# Patient Record
Sex: Female | Born: 2000 | Race: Black or African American | Hispanic: No | Marital: Single | Smoking: Never smoker
Health system: Southern US, Community
[De-identification: ages and names within clinical notes are randomized; demographics above are authoritative.]

---
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
## 2019-07-01 IMAGING — CR DG TIBIA/FIBULA 2V*L*
4 series · 4 of 4 positions shown · non-contrast
Comparison: None.

CLINICAL DATA: Awoke with left leg pain.

EXAM:
LEFT TIBIA AND FIBULA - 2 VIEW

[tibia ap (1 of 2)]
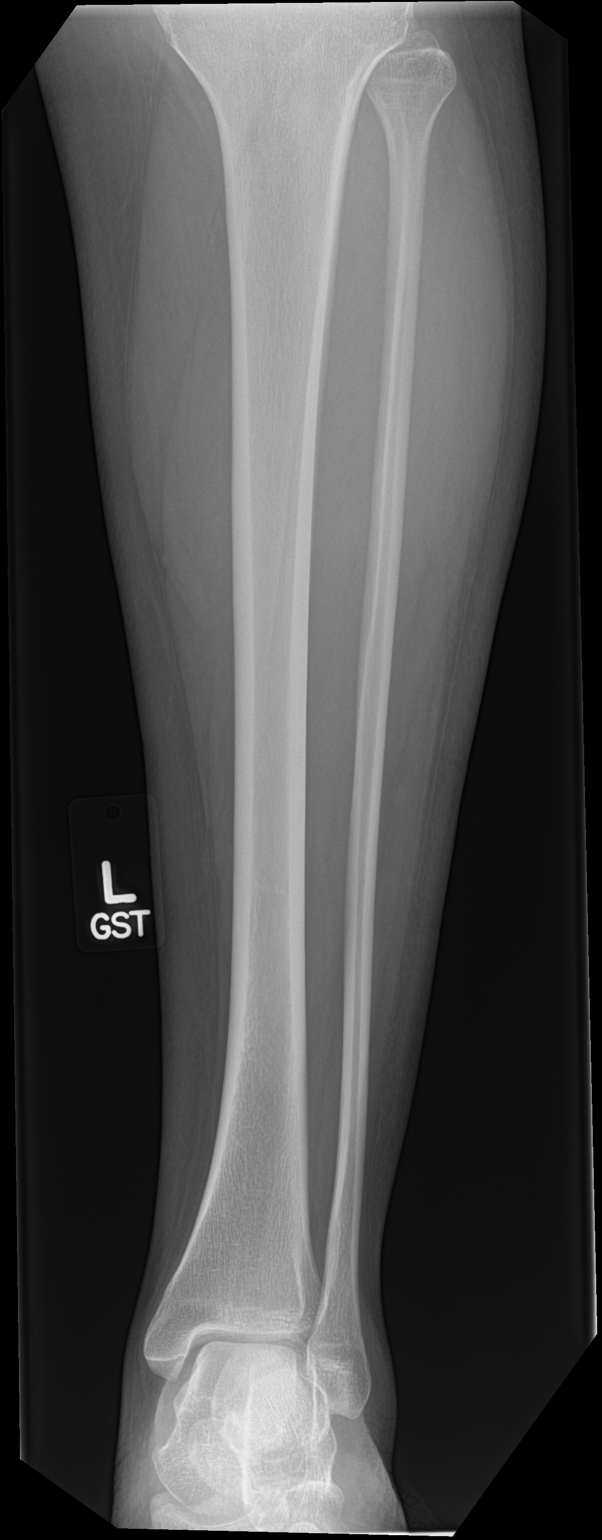

[tibia ap (2 of 2)]
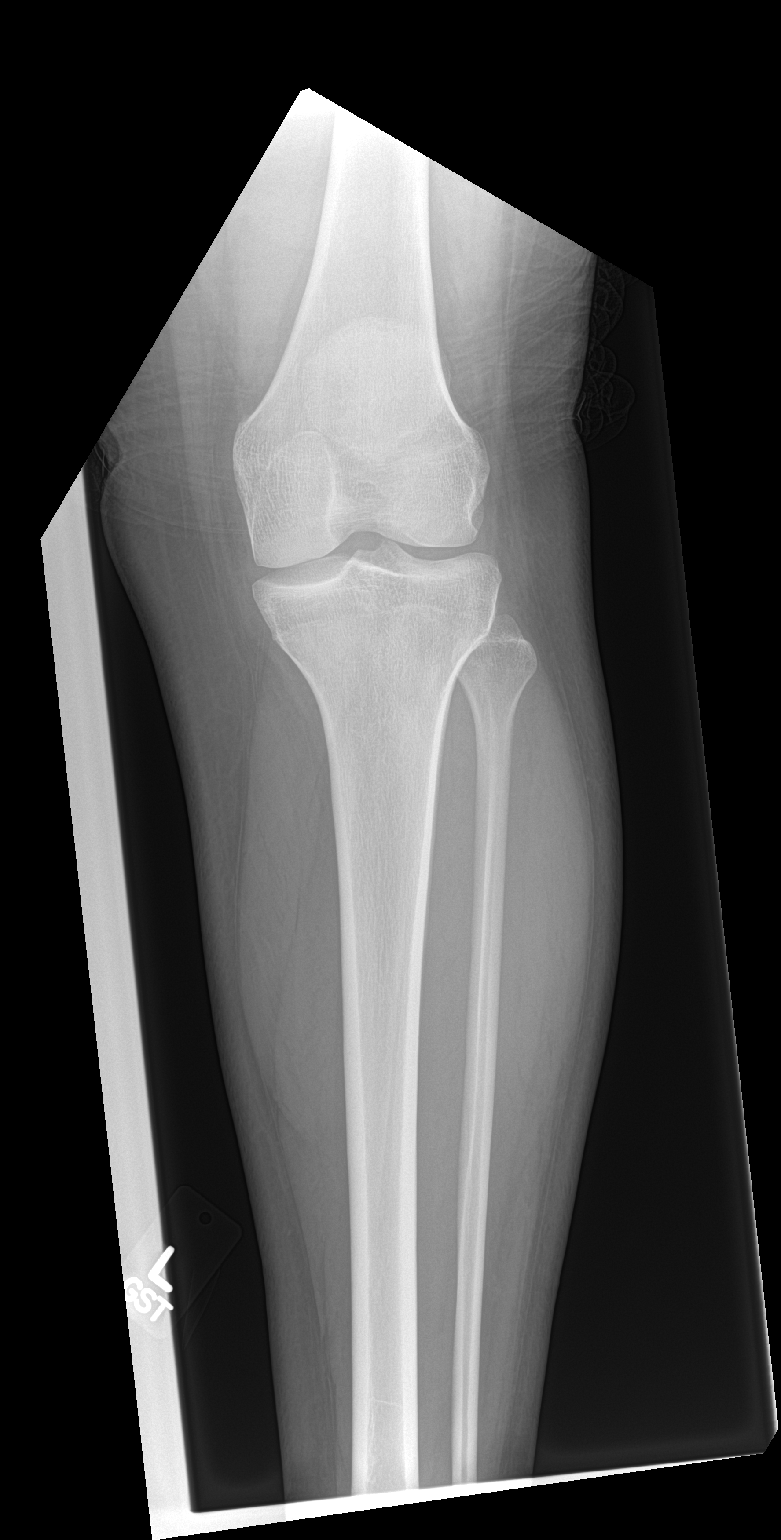

[tibia lat (1 of 2)]
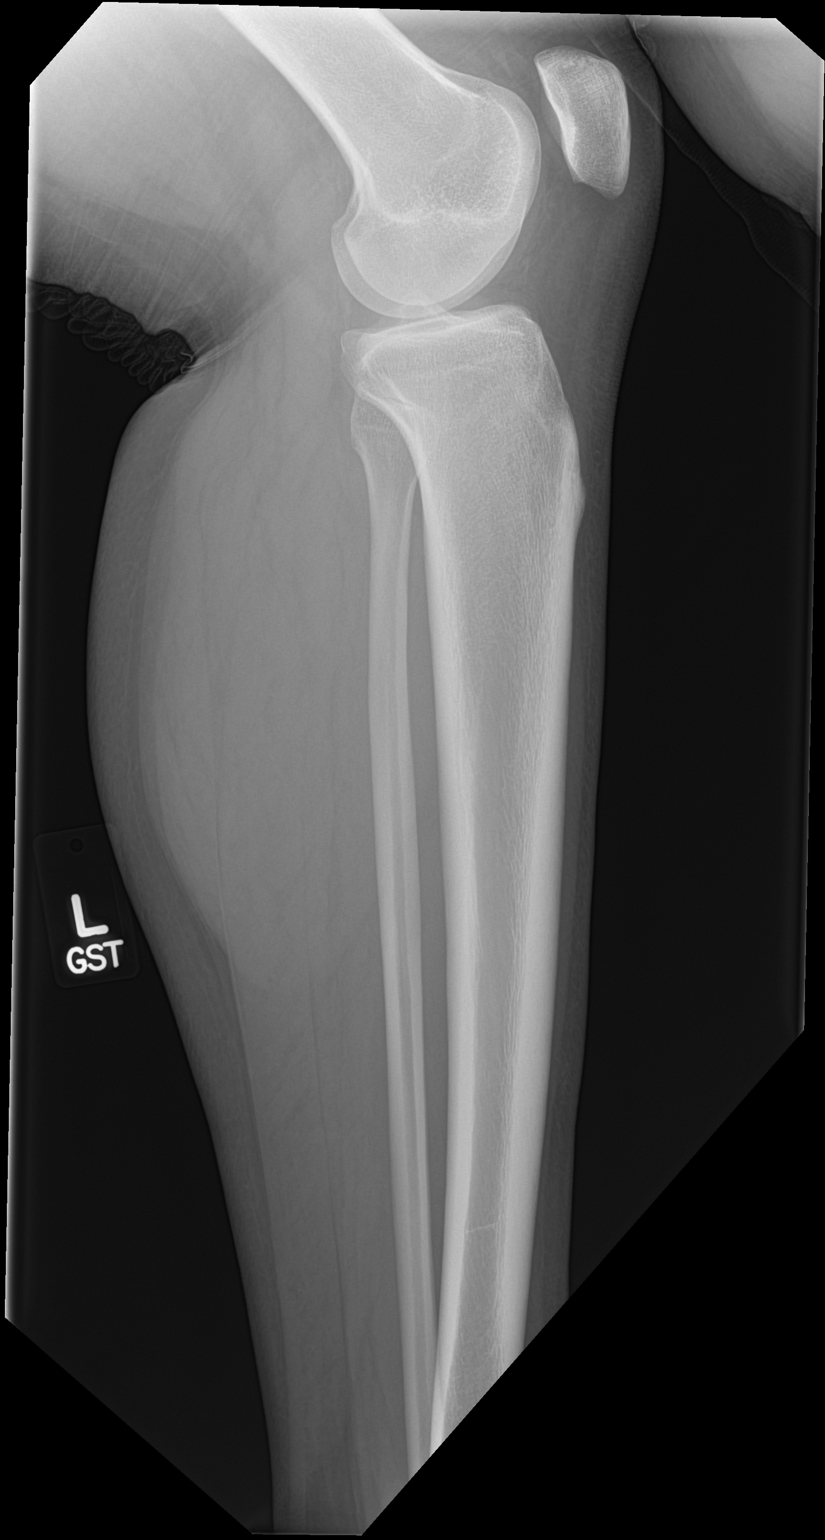

[tibia lat (2 of 2)]
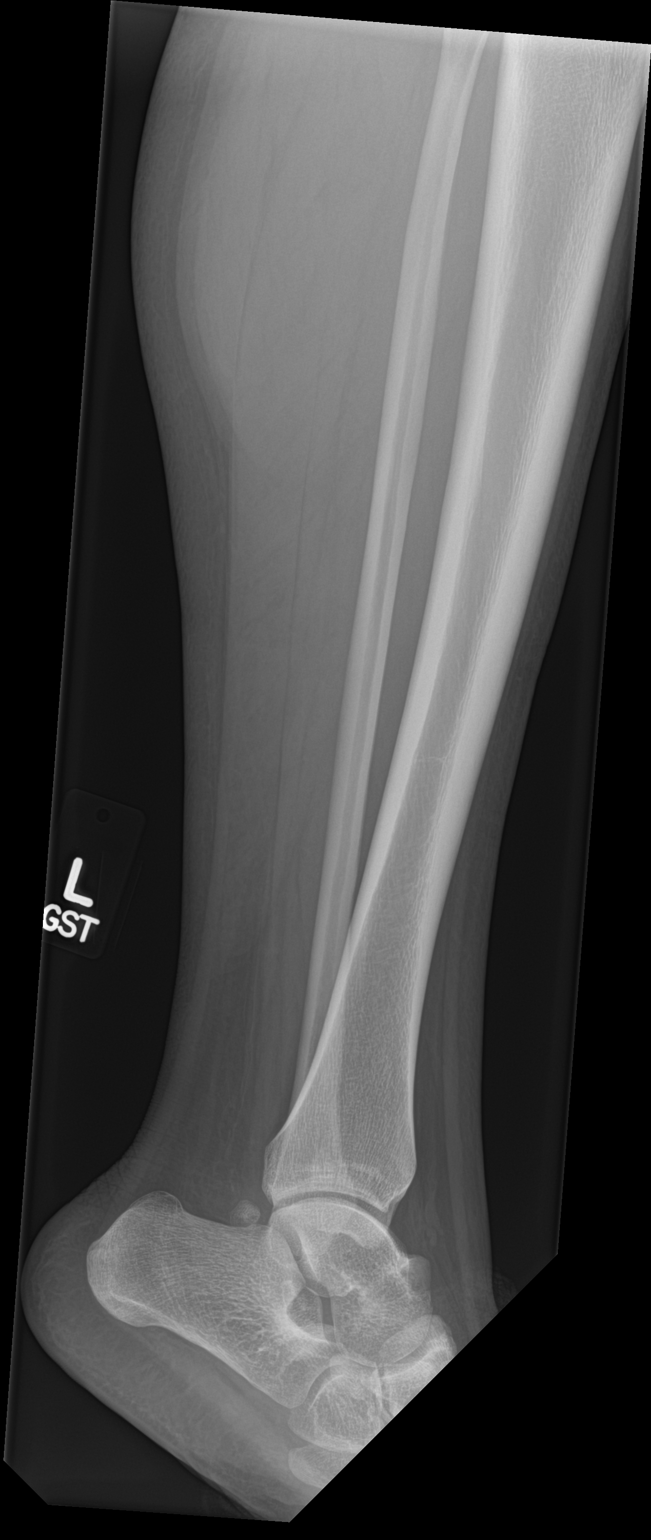

[4 of 4 positions shown; findings below may reference images not displayed]

FINDINGS: There is no evidence of fracture or other focal bone lesions. No
periosteal reaction or bony destructive change. Knee and ankle
alignment is maintained. Soft tissues are unremarkable.
IMPRESSION: Normal radiographs of the left tibia/fibula.

## 2019-12-29 ENCOUNTER — Ambulatory Visit: Payer: 59 | Attending: Internal Medicine

## 2019-12-29 DIAGNOSIS — Z23 Encounter for immunization: Secondary | ICD-10-CM

## 2019-12-29 NOTE — Progress Notes (Signed)
   Covid-19 Vaccination Clinic  Name:  Ruth Escobar    MRN: [id number] DOB: 08-08-2001  12/29/2019  [name] was observed post Covid-19 immunization for 30 minutes based on pre-vaccination screening without incident. She was provided with Vaccine Information Sheet and instruction to access the V-Safe system.   [name] was instructed to call 911 with any severe reactions post vaccine: Marland Kitchen Difficulty breathing  . Swelling of face and throat  . A fast heartbeat  . A bad rash all over body  . Dizziness and weakness   Immunizations Administered    Name Date Dose VIS Date Route   Pfizer COVID-19 Vaccine 12/29/2019  3:06 PM 0.3 mL 08/31/2019 Intramuscular   Manufacturer: [employer], [employer]   Lot: [id number]   NDC: [address]-0

## 2020-01-22 ENCOUNTER — Ambulatory Visit: Payer: 59 | Attending: Internal Medicine

## 2020-01-22 DIAGNOSIS — Z23 Encounter for immunization: Secondary | ICD-10-CM

## 2020-01-22 NOTE — Progress Notes (Signed)
   Covid-19 Vaccination Clinic  Name:  Ruth Escobar    MRN: [id number] DOB: 2000-10-19  01/22/2020  [name] was observed post Covid-19 immunization for 30 minutes based on pre-vaccination screening without incident. She was provided with Vaccine Information Sheet and instruction to access the V-Safe system.   [name] was instructed to call 911 with any severe reactions post vaccine: Marland Kitchen Difficulty breathing  . Swelling of face and throat  . A fast heartbeat  . A bad rash all over body  . Dizziness and weakness   Immunizations Administered    Name Date Dose VIS Date Route   Pfizer COVID-19 Vaccine 01/22/2020  8:54 AM 0.3 mL 11/14/2018 Intramuscular   Manufacturer: [employer], [employer]   Lot: [id number]   NDC: [address]-8

## 2020-01-23 ENCOUNTER — Ambulatory Visit: Payer: 59

## 2020-07-02 ENCOUNTER — Other Ambulatory Visit: Payer: Self-pay

## 2020-07-02 ENCOUNTER — Encounter (HOSPITAL_COMMUNITY): Payer: Self-pay | Admitting: Emergency Medicine

## 2020-07-02 ENCOUNTER — Ambulatory Visit (HOSPITAL_COMMUNITY)
Admission: EM | Admit: 2020-07-02 | Discharge: 2020-07-02 | Disposition: A | Discharge status: Home / Self Care | Payer: 59 | Attending: Family Medicine | Admitting: Family Medicine

## 2020-07-02 DIAGNOSIS — R079 Chest pain, unspecified: Secondary | ICD-10-CM | POA: Diagnosis not present

## 2020-07-02 DIAGNOSIS — M940 Chondrocostal junction syndrome [Tietze]: Secondary | ICD-10-CM

## 2020-07-02 NOTE — ED Triage Notes (Signed)
Pt c/o chest pain mid sternum x 3 weeks. 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 The pain is consistent. She denies SOB or trouble breathing. She is having some left arm pain that began today. She was having neck and jaw pain earlier that subsided with aleeve.

## 2020-07-03 NOTE — ED Provider Notes (Signed)
[facility] CARE CENTER   [id number] 07/02/20 Arrival Time: 9528  ASSESSMENT & PLAN:  1. Chest pain, unspecified type   2. Costochondritis     Patient history and exam consistent with non-cardiac cause of chest pain. Conservative measures indicated.  ECG: Performed today and interpreted by me: no signs of ischemia  Discussed suspicion of MSK related chest pain. Discussed. OTC ibuprofen 800mg  TID with food.  Chest pain precautions given. Reviewed expectations re: course of current medical issues. Questions answered. Outlined signs and symptoms indicating need for more acute intervention. Patient verbalized understanding. After Visit Summary given.   SUBJECTIVE:  History from: patient. Ruth Escobar is a 19 y.o. female who presents with complaint of intermittent non-radiating chest pain. Gradual onset over past 3 weeks. Lifts a lot at work and questions relation. Is worse with certain movements. No SOB/n/v. Afebrile. No recent illnesses. Is on OCP. No LE edema/swelling.  Illicit drug use: none.  Social History   Tobacco Use  Smoking Status Never Smoker  Smokeless Tobacco Never Used   Social History   Substance and Sexual Activity  Alcohol Use No     OBJECTIVE:  Vitals:   07/02/20 1920  BP: 122/77  Pulse: 88  Resp: 16  Temp: 98.2 F (36.8 C)  TempSrc: Oral  SpO2: 99%    General appearance: alert, oriented, no acute distress Eyes: PERRLA; EOMI; conjunctivae normal HENT: normocephalic; atraumatic Neck: supple with FROM Lungs: without labored respirations; speaks full sentences without difficulty; CTAB Heart: regular rate and rhythm without murmer Chest Wall: with upper mid to left tenderness to palpation Abdomen: soft, non-tender; no guarding or rebound tenderness Extremities: without edema; without calf swelling or tenderness; symmetrical without gross deformities Skin: warm and dry; without rash or lesions Neuro: normal gait Psychological: alert  and cooperative; normal mood and affect   Allergies  Allergen Reactions  . Citrus Rash  . Pollen Extract Cough  . Shellfish Allergy   . Elidel [Pimecrolimus] Rash    Past Medical History:  Diagnosis Date  . Asthma    Social History   Socioeconomic History  . Marital status: Single    Spouse name: Not on file  . Number of children: Not on file  . Years of education: Not on file  . Highest education level: Not on file  Occupational History  . Not on file  Tobacco Use  . Smoking status: Never Smoker  . Smokeless tobacco: Never Used  Vaping Use  . Vaping Use: Never used  Substance and Sexual Activity  . Alcohol use: No  . Drug use: No  . Sexual activity: Not on file  Other Topics Concern  . Not on file  Social History Narrative  . Not on file   Social Determinants of Health   Financial Resource Strain:   . Difficulty of Paying Living Expenses: Not on file  Food Insecurity:   . Worried About 07/04/20 in the Last Year: Not on file  . Ran Out of Food in the Last Year: Not on file  Transportation Needs:   . Lack of Transportation (Medical): Not on file  . Lack of Transportation (Non-Medical): Not on file  Physical Activity:   . Days of Exercise per Week: Not on file  . Minutes of Exercise per Session: Not on file  Stress:   . Feeling of Stress : Not on file  Social Connections:   . Frequency of Communication with Friends and Family: Not on file  . Frequency of  Social Gatherings with Friends and Family: Not on file  . Attends Religious Services: Not on file  . Active Member of Clubs or Organizations: Not on file  . Attends Banker Meetings: Not on file  . Marital Status: Not on file  Intimate Partner Violence:   . Fear of Current or Ex-Partner: Not on file  . Emotionally Abused: Not on file  . Physically Abused: Not on file  . Sexually Abused: Not on file   Family History  Problem Relation Age of Onset  . Cancer Mother   . Cancer  Father   . Hypertension Father    History reviewed. No pertinent surgical history.   Mardella Layman, MD 07/03/20 1006

## 2022-02-11 ENCOUNTER — Ambulatory Visit (HOSPITAL_COMMUNITY)
Admission: EM | Admit: 2022-02-11 | Discharge: 2022-02-11 | Disposition: A | Discharge status: Home / Self Care | Payer: 59 | Attending: Internal Medicine | Admitting: Internal Medicine

## 2022-02-11 ENCOUNTER — Encounter (HOSPITAL_COMMUNITY): Payer: Self-pay

## 2022-02-11 DIAGNOSIS — J029 Acute pharyngitis, unspecified: Secondary | ICD-10-CM | POA: Diagnosis present

## 2022-02-11 DIAGNOSIS — H9201 Otalgia, right ear: Secondary | ICD-10-CM

## 2022-02-11 LAB — POCT RAPID STREP A, ED / UC: Streptococcus, Group A Screen (Direct): NEGATIVE

## 2022-02-11 MED ORDER — AMOXICILLIN 500 MG PO CAPS: 500.0000 mg | ORAL_CAPSULE | Freq: Two times a day (BID) | ORAL | 0 refills | Status: AC

## 2022-02-11 NOTE — ED Triage Notes (Signed)
Onset 3 days ago with sore throat,body aches and ear pain. Pt has been dealing with mold in her apartment.

## 2022-02-11 NOTE — ED Provider Notes (Signed)
[facility]    CSN: [id number] Arrival date & time: 02/11/22  1928      History   Chief Complaint Chief Complaint  Patient presents with   Fever   Sore Throat   Otalgia    HPI Ruth Escobar is a 21 y.o. female.   Patient presents with sore throat and right ear pain that has been present for approximately 4 days.  Denies any associated respiratory symptoms, cough, fever, any known sick contacts.  Denies chest pain, shortness of breath, nausea, vomiting, diarrhea, abdominal pain.  Patient has taken several over-the-counter cold and flu medications as well as Tylenol with minimal improvement in symptoms.  Patient also reports that they had an episode of feeling like their "heart was racing" and mild chest pain when symptoms first started that has now resolved.  This episode lasted approximately 5 minutes and there has been no episodes since.  Denies history of any cardiac issues.  Patient attributing this to [place] the [place] when symptoms first started and "not feeling well" when palpitations occur.   Fever Sore Throat  Otalgia  Past Medical History:  Diagnosis Date   Asthma     There are no problems to display for this patient.   History reviewed. No pertinent surgical history.  OB History   No obstetric history on file.      Home Medications    Prior to Admission medications   Medication Sig Start Date End Date Taking? Authorizing Provider  amoxicillin (AMOXIL) 500 MG capsule Take 1 capsule (500 mg total) by mouth 2 (two) times daily for 10 days. 02/11/22 02/21/22 Yes Chardonnay Holzmann, Acie Fredrickson, FNP  Albuterol Sulfate (PROAIR HFA IN) Inhale into the lungs.    [provider]  albuterol-ipratropium (COMBIVENT) 18-103 MCG/ACT inhaler Inhale 2 puffs into the lungs every 6 (six) hours as needed.    [provider]  azithromycin (ZITHROMAX) 250 MG tablet Take as directed Patient not taking: Reported on 01/28/2017 09/24/16   Lenn Sink,  DPM  azithromycin [facility]) 250 MG tablet Take as directed 01/28/17   Lenn Sink, DPM  beclomethasone (QVAR) 80 MCG/ACT inhaler Inhale 2 puffs into the lungs 2 (two) times daily. 08/08/15   Baxter Hire, MD  cetirizine (ZYRTEC) 10 MG tablet Take 10 mg by mouth daily.    [provider]  desonide (DESOWEN) 0.05 % cream Apply 1 application topically 2 (two) times daily.    [provider]  desoximetasone (TOPICORT) 0.05 % cream Apply topically 2 (two) times daily.    [provider]  fluocinonide cream (LIDEX) 0.05 % Apply 1 application topically 2 (two) times daily.    [provider]  fluticasone (FLONASE) 50 MCG/ACT nasal spray Place 2 sprays into both nostrils daily.    [provider]  hydrocodone-ibuprofen (VICOPROFEN) 5-200 MG tablet Take 1 tablet by mouth every 8 (eight) hours as needed for pain. Patient not taking: Reported on 01/28/2017 01/23/16   Lenn Sink, DPM  hydrocortisone cream 0.5 % Apply 1 application topically as needed for itching.    [provider]  montelukast (SINGULAIR) 10 MG tablet Take 10 mg by mouth at bedtime.    [provider]  Norethindrone Acetate-Ethinyl Estrad-FE (BLISOVI 24 FE) 1-20 MG-MCG(24) tablet Blisovi 24 Fe 1 mg-20 mcg (24)/75 mg (4) tablet  TAKE 1 TABLET BY MOUTH EVERY DAY    [provider]  triamcinolone cream (KENALOG) 0.1 % Apply 1 application topically  2 (two) times daily.    [provider]    Family History Family History  Problem Relation Age of Onset   Cancer Mother    Cancer Father    Hypertension Father     Social History Social History   Tobacco Use   Smoking status: Never   Smokeless tobacco: Never  Vaping Use   Vaping Use: Never used  Substance Use Topics   Alcohol use: No   Drug use: No     Allergies   Citrus, Pollen extract, Shellfish allergy, and Elidel [pimecrolimus]   Review of Systems Review of Systems Per  HPI  Physical Exam Triage Vital Signs ED Triage Vitals [02/11/22 2004]  Enc Vitals Group     BP 127/87     Pulse Rate (!) 115     Resp 18     Temp 99.5 F (37.5 C)     Temp Source Oral     SpO2 96 %     Weight      Height      Head Circumference      Peak Flow      Pain Score 7     Pain Loc      Pain Edu?      Excl. in GC?    No data found.  Updated Vital Signs BP 127/87 (BP Location: Left Arm)   Pulse (!) 115   Temp 99.5 F (37.5 C) (Oral)   Resp 18   SpO2 96%   Visual Acuity Right Eye Distance:   Left Eye Distance:   Bilateral Distance:    Right Eye Near:   Left Eye Near:    Bilateral Near:     Physical Exam Constitutional:      General: She is not in acute distress.    Appearance: Normal appearance. She is not toxic-appearing or diaphoretic.  HENT:     Head: Normocephalic and atraumatic.     Right Ear: Tympanic membrane and ear canal normal.     Left Ear: Tympanic membrane and ear canal normal.     Nose: Congestion present.     Mouth/Throat:     Mouth: Mucous membranes are moist.     Pharynx: Posterior oropharyngeal erythema present. No oropharyngeal exudate.     Tonsils: No tonsillar exudate or tonsillar abscesses. 1+ on the right. 1+ on the left.  Eyes:     Extraocular Movements: Extraocular movements intact.     Conjunctiva/sclera: Conjunctivae normal.     Pupils: Pupils are equal, round, and reactive to light.  Cardiovascular:     Rate and Rhythm: Normal rate and regular rhythm.     Pulses: Normal pulses.     Heart sounds: Normal heart sounds.  Pulmonary:     Effort: Pulmonary effort is normal. No respiratory distress.     Breath sounds: Normal breath sounds. No stridor. No wheezing, rhonchi or rales.  Abdominal:     General: Abdomen is flat. Bowel sounds are normal.     Palpations: Abdomen is soft.  Musculoskeletal:        General: Normal range of motion.     Cervical back: Normal range of motion.  Skin:    General: Skin is warm and dry.   Neurological:     General: No focal deficit present.     Mental Status: She is alert and oriented to person, place, and time. Mental status is at baseline.  Psychiatric:        Mood and Affect: Mood  normal.        Behavior: Behavior normal.     UC Treatments / Results  Labs (all labs ordered are listed, but only abnormal results are displayed) Labs Reviewed  CULTURE, GROUP A STREP [facility])  POCT RAPID STREP A, ED / UC    EKG   Radiology No results found.  Procedures Procedures (including critical care time)  Medications Ordered in UC Medications - No data to display  Initial Impression / Assessment and Plan / UC Course  I have reviewed the triage vital signs and the nursing notes.  Pertinent labs & imaging results that were available during my care of the patient were reviewed by me and considered in my medical decision making (see chart for details).     Rapid strep was negative but still suspicious of strep throat given appearance of posterior pharynx on exam.  No signs of peritonsillar abscess on exam.  Will opt to treat with amoxicillin antibiotic.  Discussed that there is a possibility of viral cause of symptoms as well.  Discussed supportive care and symptom management with patient.  Patient's cardiovascular exam was normal and patient denies any recent palpitations.  Offered patient EKG for evaluation but patient declined.  Risks associated with not doing EKG were discussed with patient.  Also discussed that there are limited resources to evaluate cardiovascular etiologies in urgent care.  No concern for cardiac etiology as this episode of tachycardia was most likely due to acute illness.  Patient was advised that if symptoms of palpitations occur again, then they need to go to the hospital for further evaluation and management.  Patient voiced understanding.  Discussed strict return and ER precautions.  Patient verbalized understanding and was agreeable with plan. Final  Clinical Impressions(s) / UC Diagnoses   Final diagnoses:  Sore throat  Acute otalgia, right     Discharge Instructions      Rapid strep was negative.  Throat culture is pending.  I am still highly suspicious of strep throat given the appearance of your throat on exam.  You are being treated with an antibiotic.  Please follow-up if symptoms persist or worsen.    ED Prescriptions     Medication Sig Dispense Auth. Provider   amoxicillin (AMOXIL) 500 MG capsule Take 1 capsule (500 mg total) by mouth 2 (two) times daily for 10 days. 20 capsule Gustavus Bryant, [place]      PDMP not reviewed this encounter.   Gustavus Bryant, [place] 02/11/22 2037

## 2022-02-11 NOTE — Discharge Instructions (Signed)
Rapid strep was negative.  Throat culture is pending.  I am still highly suspicious of strep throat given the appearance of your throat on exam.  You are being treated with an antibiotic.  Please follow-up if symptoms persist or worsen.

## 2022-02-13 LAB — CULTURE, GROUP A STREP (THRC)

## 2022-10-01 DIAGNOSIS — H5213 Myopia, bilateral: Secondary | ICD-10-CM | POA: Diagnosis not present

## 2022-12-15 DIAGNOSIS — Z01419 Encounter for gynecological examination (general) (routine) without abnormal findings: Secondary | ICD-10-CM | POA: Diagnosis not present

## 2022-12-15 DIAGNOSIS — N946 Dysmenorrhea, unspecified: Secondary | ICD-10-CM | POA: Diagnosis not present

## 2022-12-15 DIAGNOSIS — Z6839 Body mass index (BMI) 39.0-39.9, adult: Secondary | ICD-10-CM | POA: Diagnosis not present

## 2022-12-18 DIAGNOSIS — R079 Chest pain, unspecified: Secondary | ICD-10-CM | POA: Diagnosis not present

## 2022-12-19 DIAGNOSIS — R079 Chest pain, unspecified: Secondary | ICD-10-CM | POA: Diagnosis not present

## 2023-03-04 ENCOUNTER — Encounter (HOSPITAL_COMMUNITY): Payer: Self-pay | Admitting: Emergency Medicine

## 2023-03-04 ENCOUNTER — Ambulatory Visit (INDEPENDENT_AMBULATORY_CARE_PROVIDER_SITE_OTHER): Payer: 59

## 2023-03-04 ENCOUNTER — Ambulatory Visit (HOSPITAL_COMMUNITY)
Admission: EM | Admit: 2023-03-04 | Discharge: 2023-03-04 | Disposition: A | Discharge status: Home / Self Care | Payer: 59 | Attending: Internal Medicine | Admitting: Internal Medicine

## 2023-03-04 DIAGNOSIS — Z8709 Personal history of other diseases of the respiratory system: Secondary | ICD-10-CM

## 2023-03-04 DIAGNOSIS — S60011A Contusion of right thumb without damage to nail, initial encounter: Secondary | ICD-10-CM | POA: Diagnosis not present

## 2023-03-04 DIAGNOSIS — S6991XA Unspecified injury of right wrist, hand and finger(s), initial encounter: Secondary | ICD-10-CM | POA: Diagnosis not present

## 2023-03-04 MED ORDER — IBUPROFEN 800 MG PO TABS: 800.0000 mg | ORAL_TABLET | Freq: Three times a day (TID) | ORAL | 0 refills | Status: DC

## 2023-03-04 MED ORDER — ALBUTEROL SULFATE HFA 108 (90 BASE) MCG/ACT IN AERS: 2.0000 | INHALATION_SPRAY | Freq: Four times a day (QID) | RESPIRATORY_TRACT | 0 refills | Status: AC | PRN

## 2023-03-04 NOTE — ED Provider Notes (Signed)
[facility]    CSN: [id number] Arrival date & time: 03/04/23  0825      History   Chief Complaint Chief Complaint  Patient presents with   Finger Injury    HPI Ruth Escobar is a 22 y.o. female.   Patient presents today with a 2-day history of right thumb pain.  Reports that she closed a portion of her thumb and her car door and has had ongoing pain since that time.  If she is not moving it pain is minimal but increases to 2/3 with attempted movement and increases to 10 with palpation, localized to distal right thumb, described as throbbing, no aggravating relieving factors identified.  She is right-handed.  Denies any numbness or paresthesias.  She has tried ibuprofen with minimal improvement of symptoms.  She is confident that she is not pregnant.  She is having difficulty with daily activities as result of her symptoms.    Past Medical History:  Diagnosis Date   Asthma     There are no problems to display for this patient.   History reviewed. No pertinent surgical history.  OB History   No obstetric history on file.      Home Medications    Prior to Admission medications   Medication Sig Start Date End Date Taking? Authorizing Provider  ibuprofen (ADVIL) 800 MG tablet Take 1 tablet (800 mg total) by mouth 3 (three) times daily. 03/04/23  Yes Marchell Froman K, PA-C  albuterol (PROAIR HFA) 108 (90 Base) MCG/ACT inhaler Inhale 2 puffs into the lungs every 6 (six) hours as needed. 03/04/23   Kassadee Carawan, Noberto Retort, PA-C  albuterol-ipratropium (COMBIVENT) 18-103 MCG/ACT inhaler Inhale 2 puffs into the lungs every 6 (six) hours as needed.    [provider]  beclomethasone (QVAR) 80 MCG/ACT inhaler Inhale 2 puffs into the lungs 2 (two) times daily. 08/08/15   Baxter Hire, MD  cetirizine (ZYRTEC) 10 MG tablet Take 10 mg by mouth daily.    [provider]  desonide (DESOWEN) 0.05 % cream Apply 1 application topically 2 (two) times daily.     [provider]  desoximetasone (TOPICORT) 0.05 % cream Apply topically 2 (two) times daily.    [provider]  fluocinonide cream (LIDEX) 0.05 % Apply 1 application topically 2 (two) times daily.    [provider]  fluticasone (FLONASE) 50 MCG/ACT nasal spray Place 2 sprays into both nostrils daily.    [provider]  hydrocodone-ibuprofen (VICOPROFEN) 5-200 MG tablet Take 1 tablet by mouth every 8 (eight) hours as needed for pain. Patient not taking: Reported on 01/28/2017 01/23/16   Lenn Sink, DPM  hydrocortisone cream 0.5 % Apply 1 application topically as needed for itching.    [provider]  montelukast (SINGULAIR) 10 MG tablet Take 10 mg by mouth at bedtime.    [provider]  Norethindrone Acetate-Ethinyl Estrad-FE (BLISOVI 24 FE) 1-20 MG-MCG(24) tablet Blisovi 24 Fe 1 mg-20 mcg (24)/75 mg (4) tablet  TAKE 1 TABLET BY MOUTH EVERY DAY    [provider]  triamcinolone cream (KENALOG) 0.1 % Apply 1 application topically 2 (two) times daily.    [provider]    Family History Family History  Problem Relation Age of Onset   Cancer Mother    Cancer Father    Hypertension Father     Social History Social History   Tobacco Use   Smoking status: Never   Smokeless tobacco: Never  Vaping  Use   Vaping Use: Never used  Substance Use Topics   Alcohol use: No   Drug use: No     Allergies   Citrus, Pollen extract, Iodine, Shellfish allergy, and Elidel [pimecrolimus]   Review of Systems Review of Systems  Constitutional:  Positive for activity change. Negative for appetite change, fatigue and fever.  Musculoskeletal:  Positive for arthralgias. Negative for myalgias.  Skin:  Negative for color change and wound.  Neurological:  Negative for weakness and numbness.     Physical Exam Triage Vital Signs ED Triage Vitals  Enc Vitals Group     BP 03/04/23 0924 113/81     Pulse Rate 03/04/23 0924 98      Resp 03/04/23 0924 17     Temp 03/04/23 0924 98.3 F (36.8 C)     Temp Source 03/04/23 0924 Oral     SpO2 03/04/23 0924 99 %     Weight --      Height --      Head Circumference --      Peak Flow --      Pain Score 03/04/23 0921 4     Pain Loc --      Pain Edu? --      Excl. in GC? --    No data found.  Updated Vital Signs BP 113/81 (BP Location: Left Arm)   Pulse 98   Temp 98.3 F (36.8 C) (Oral)   Resp 17   LMP 02/14/2023 (Approximate)   SpO2 99%   Visual Acuity Right Eye Distance:   Left Eye Distance:   Bilateral Distance:    Right Eye Near:   Left Eye Near:    Bilateral Near:     Physical Exam Vitals reviewed.  Constitutional:      General: She is awake. She is not in acute distress.    Appearance: Normal appearance. She is well-developed. She is not ill-appearing.     Comments: Very pleasant female appears stated age in no acute distress sitting comfortably in exam room  HENT:     Head: Normocephalic and atraumatic.  Cardiovascular:     Rate and Rhythm: Normal rate and regular rhythm.     Heart sounds: Normal heart sounds, S1 normal and S2 normal. No murmur heard.    Comments: Capillary refill within 2 seconds right thumb Pulmonary:     Effort: Pulmonary effort is normal.     Breath sounds: Normal breath sounds. No wheezing, rhonchi or rales.     Comments: Clear to auscultation bilaterally Musculoskeletal:     Right hand: Swelling, tenderness and bony tenderness present. No deformity. Decreased range of motion. Normal strength. Normal sensation. There is no disruption of two-point discrimination. Normal capillary refill.     Comments: Right hand: Significant tenderness palpation over distal right thumb with associated small subungual hematoma involving approximately 20% of nail.  Decreased range of motion with flexion secondary to pain.  Hand is neurovascularly intact.  Psychiatric:        Behavior: Behavior is cooperative.      UC Treatments /  Results  Labs (all labs ordered are listed, but only abnormal results are displayed) Labs Reviewed - No data to display  EKG   Radiology DG Finger Thumb Right  Result Date: 03/04/2023 CLINICAL DATA:  Smashed in car door.  Bruising under thumb nail. EXAM: RIGHT THUMB 2+V COMPARISON:  None Available. FINDINGS: Normal bone mineralization. Joint spaces are preserved. The cortices are intact. No acute fracture is seen.  No dislocation. IMPRESSION: No acute fracture. Electronically Signed   By: Neita Garnet M.D.   On: 03/04/2023 09:56    Procedures Procedures (including critical care time)  Medications Ordered in UC Medications - No data to display  Initial Impression / Assessment and Plan / UC Course  I have reviewed the triage vital signs and the nursing notes.  Pertinent labs & imaging results that were available during my care of the patient were reviewed by me and considered in my medical decision making (see chart for details).     Patient is well-appearing, afebrile, nontoxic, nontachycardic.  X-ray was obtained that showed no acute osseous abnormality.  Suspect contusion as etiology of symptoms.  We did discuss potential utility of draining subungual hematoma but given it is relatively small patient declined this as I am unsure that it would provide much relief of symptoms.  She was placed in a makeshift splint for comfort and support.  Will start ibuprofen 800 mg for pain and we discussed that she is not to take NSAIDs with this medication due to risk of GI bleeding.  She can use acetaminophen/Tylenol for breakthrough pain.  If her symptoms or not improving or if anything worsens she is to return for reevaluation including increasing pain, swelling, discoloration, numbness, paresthesias.  Strict return precautions given.  Patient declined work excuse note.  At the end of visit patient requested a refill of her albuterol rescue inhaler.  Reports that she uses intermittently as needed  but will not see her primary care for a few weeks and is hoping she can get a refill now.  She denies any current asthma symptoms.  Denies previous hospitalization related to asthma or intubation.  Discussed that if she has recurrent or severe symptoms she should return for reevaluation.  Final Clinical Impressions(s) / UC Diagnoses   Final diagnoses:  Contusion of right thumb without damage to nail, initial encounter  History of asthma     Discharge Instructions      Your x-ray was normal with no evidence of fracture/broken bones.  This probably take several weeks to heal.  Use the splint to help provide protection and comfort.  Take ibuprofen 800 mg for pain.  Do not take NSAIDs with this medication including aspirin, ibuprofen/Advil, naproxen/Aleve.  You can use acetaminophen/Tylenol for breakthrough pain.  If your symptoms or not improving within a few days please return for reevaluation.  If anything worsens return for reevaluation.  I refilled your inhaler.  Follow-up with your primary care.  If anything worsens return for reevaluation.     ED Prescriptions     Medication Sig Dispense Auth. Provider   albuterol (PROAIR HFA) 108 (90 Base) MCG/ACT inhaler Inhale 2 puffs into the lungs every 6 (six) hours as needed. 18 g Jayde Mcallister K, PA-C   ibuprofen (ADVIL) 800 MG tablet Take 1 tablet (800 mg total) by mouth 3 (three) times daily. 21 tablet Juliani Laduke, Noberto Retort, PA-C      PDMP not reviewed this encounter.   Jeani Hawking, PA-C 03/04/23 1014

## 2023-03-04 NOTE — Discharge Instructions (Addendum)
Your x-ray was normal with no evidence of fracture/broken bones.  This probably take several weeks to heal.  Use the splint to help provide protection and comfort.  Take ibuprofen 800 mg for pain.  Do not take NSAIDs with this medication including aspirin, ibuprofen/Advil, naproxen/Aleve.  You can use acetaminophen/Tylenol for breakthrough pain.  If your symptoms or not improving within a few days please return for reevaluation.  If anything worsens return for reevaluation.  I refilled your inhaler.  Follow-up with your primary care.  If anything worsens return for reevaluation.

## 2023-03-04 NOTE — ED Triage Notes (Signed)
Pt reports right thumb got smashed 2 days ago in car door. Pt reports bruising under right thumb nail. Reports pain with pain.

## 2023-03-07 DIAGNOSIS — R7309 Other abnormal glucose: Secondary | ICD-10-CM | POA: Diagnosis not present

## 2023-03-07 DIAGNOSIS — E669 Obesity, unspecified: Secondary | ICD-10-CM | POA: Diagnosis not present

## 2023-03-07 DIAGNOSIS — E559 Vitamin D deficiency, unspecified: Secondary | ICD-10-CM | POA: Diagnosis not present

## 2023-03-07 DIAGNOSIS — D649 Anemia, unspecified: Secondary | ICD-10-CM | POA: Diagnosis not present

## 2023-03-07 DIAGNOSIS — Z0000 Encounter for general adult medical examination without abnormal findings: Secondary | ICD-10-CM | POA: Diagnosis not present

## 2023-03-09 ENCOUNTER — Emergency Department (HOSPITAL_COMMUNITY)
Admission: EM | Admit: 2023-03-09 | Discharge: 2023-03-10 | Disposition: A | Discharge status: Home / Self Care | Payer: 59 | Attending: Student | Admitting: Student

## 2023-03-09 ENCOUNTER — Encounter (HOSPITAL_COMMUNITY): Payer: Self-pay

## 2023-03-09 ENCOUNTER — Other Ambulatory Visit: Payer: Self-pay

## 2023-03-09 DIAGNOSIS — J45909 Unspecified asthma, uncomplicated: Secondary | ICD-10-CM | POA: Insufficient documentation

## 2023-03-09 DIAGNOSIS — R000 Tachycardia, unspecified: Secondary | ICD-10-CM | POA: Diagnosis not present

## 2023-03-09 DIAGNOSIS — R0602 Shortness of breath: Secondary | ICD-10-CM | POA: Diagnosis not present

## 2023-03-09 DIAGNOSIS — R002 Palpitations: Secondary | ICD-10-CM | POA: Insufficient documentation

## 2023-03-09 DIAGNOSIS — Z7951 Long term (current) use of inhaled steroids: Secondary | ICD-10-CM | POA: Insufficient documentation

## 2023-03-09 DIAGNOSIS — R079 Chest pain, unspecified: Secondary | ICD-10-CM | POA: Insufficient documentation

## 2023-03-09 LAB — CBC WITH DIFFERENTIAL/PLATELET
Abs Immature Granulocytes: 0.01 10*3/uL (ref 0.00–0.07)
Basophils Absolute: 0 10*3/uL (ref 0.0–0.1)
Basophils Relative: 1 %
Eosinophils Absolute: 0.2 10*3/uL (ref 0.0–0.5)
Eosinophils Relative: 2 %
HCT: 38.3 % (ref 36.0–46.0)
Hemoglobin: 12.3 g/dL (ref 12.0–15.0)
Immature Granulocytes: 0 %
Lymphocytes Relative: 42 %
Lymphs Abs: 3.7 10*3/uL (ref 0.7–4.0)
MCH: 27.6 pg (ref 26.0–34.0)
MCHC: 32.1 g/dL (ref 30.0–36.0)
MCV: 86.1 fL (ref 80.0–100.0)
Monocytes Absolute: 0.7 10*3/uL (ref 0.1–1.0)
Monocytes Relative: 8 %
Neutro Abs: 4.2 10*3/uL (ref 1.7–7.7)
Neutrophils Relative %: 47 %
Platelets: 308 10*3/uL (ref 150–400)
RBC: 4.45 MIL/uL (ref 3.87–5.11)
RDW: 11.9 % (ref 11.5–15.5)
WBC: 8.8 10*3/uL (ref 4.0–10.5)
nRBC: 0 % (ref 0.0–0.2)

## 2023-03-09 NOTE — ED Triage Notes (Signed)
Arrives POV with mother. Says earlier in the day has been having some intermittent mid sternal chest pains with no radiation.   Around 9PM began having left arm numbness.   Laying in bed ~1030PM tonight while in bed noticed he was ~150bpm.

## 2023-03-10 LAB — COMPREHENSIVE METABOLIC PANEL
ALT: 16 U/L (ref 0–44)
AST: 16 U/L (ref 15–41)
Albumin: 4.1 g/dL (ref 3.5–5.0)
Alkaline Phosphatase: 35 U/L — ABNORMAL LOW (ref 38–126)
Anion gap: 7 (ref 5–15)
BUN: 16 mg/dL (ref 6–20)
CO2: 23 mmol/L (ref 22–32)
Calcium: 9.3 mg/dL (ref 8.9–10.3)
Chloride: 106 mmol/L (ref 98–111)
Creatinine, Ser: 0.96 mg/dL (ref 0.44–1.00)
GFR, Estimated: >60 mL/min (ref >60)
Glucose, Bld: 103 mg/dL — ABNORMAL HIGH (ref 70–99)
Potassium: 3.6 mmol/L (ref 3.5–5.1)
Sodium: 136 mmol/L (ref 135–145)
Total Bilirubin: 0.7 mg/dL (ref 0.3–1.2)
Total Protein: 7.9 g/dL (ref 6.5–8.1)

## 2023-03-10 LAB — TROPONIN I (HIGH SENSITIVITY): Troponin I (High Sensitivity): <2 ng/L (ref <18)

## 2023-03-10 LAB — D-DIMER, QUANTITATIVE: D-Dimer, Quant: 0.32 ug/mL-FEU (ref 0.00–0.50)

## 2023-03-10 MED ORDER — HYDROXYZINE HCL 25 MG PO TABS: 25.0000 mg | ORAL_TABLET | Freq: Four times a day (QID) | ORAL | 0 refills | Status: DC

## 2023-03-10 NOTE — ED Provider Notes (Signed)
[place] EMERGENCY DEPARTMENT AT [facility] Provider Note  CSN: [id number] Arrival date & time: 03/09/23 2253  Chief Complaint(s) No chief complaint on file.  HPI Ruth Escobar is a 22 y.o. female with PMH asthma who presents emergency room for evaluation of chest pain, shortness of breath and rapid heart rate.  She states that she has had the symptoms at least 3 times before and this evening woke up from sleep with some tingling of the hands that was then followed by palpitations, chest pain and shortness of breath.  She states that she took her heart rate via finger pulse oximeter and found her heart rate to be 160.  Symptoms lasted for approximately 5 minutes and then resolved.  Here in the emergency room she is asymptomatic denying chest pain, shortness of breath, abdominal pain, nausea, vomiting or any other systemic symptoms.   Past Medical History Past Medical History:  Diagnosis Date   Asthma    There are no problems to display for this patient.  Home Medication(s) Prior to Admission medications   Medication Sig Start Date End Date Taking? Authorizing Provider  hydrOXYzine (ATARAX) 25 MG tablet Take 1 tablet (25 mg total) by mouth every 6 (six) hours. 03/10/23  Yes Venna Berberich, MD  albuterol (PROAIR HFA) 108 (90 Base) MCG/ACT inhaler Inhale 2 puffs into the lungs every 6 (six) hours as needed. 03/04/23   Raspet, Noberto Retort, PA-C  albuterol-ipratropium (COMBIVENT) 18-103 MCG/ACT inhaler Inhale 2 puffs into the lungs every 6 (six) hours as needed.    [provider]  beclomethasone (QVAR) 80 MCG/ACT inhaler Inhale 2 puffs into the lungs 2 (two) times daily. 08/08/15   Baxter Hire, MD  cetirizine (ZYRTEC) 10 MG tablet Take 10 mg by mouth daily.    [provider]  desonide (DESOWEN) 0.05 % cream Apply 1 application topically 2 (two) times daily.    [provider]  desoximetasone (TOPICORT) 0.05 % cream Apply topically 2 (two) times  daily.    [provider]  fluocinonide cream (LIDEX) 0.05 % Apply 1 application topically 2 (two) times daily.    [provider]  fluticasone (FLONASE) 50 MCG/ACT nasal spray Place 2 sprays into both nostrils daily.    [provider]  hydrocodone-ibuprofen (VICOPROFEN) 5-200 MG tablet Take 1 tablet by mouth every 8 (eight) hours as needed for pain. Patient not taking: Reported on 01/28/2017 01/23/16   Lenn Sink, DPM  hydrocortisone cream 0.5 % Apply 1 application topically as needed for itching.    [provider]  ibuprofen (ADVIL) 800 MG tablet Take 1 tablet (800 mg total) by mouth 3 (three) times daily. 03/04/23   Raspet, Erin K, PA-C  montelukast (SINGULAIR) 10 MG tablet Take 10 mg by mouth at bedtime.    [provider]  Norethindrone Acetate-Ethinyl Estrad-FE (BLISOVI 24 FE) 1-20 MG-MCG(24) tablet Blisovi 24 Fe 1 mg-20 mcg (24)/75 mg (4) tablet  TAKE 1 TABLET BY MOUTH EVERY DAY    [provider]  triamcinolone cream (KENALOG) 0.1 % Apply 1 application topically 2 (two) times daily.    [provider]  Past Surgical History History reviewed. No pertinent surgical history. Family History Family History  Problem Relation Age of Onset   Cancer Mother    Cancer Father    Hypertension Father     Social History Social History   Tobacco Use   Smoking status: Never   Smokeless tobacco: Never  Vaping Use   Vaping Use: Never used  Substance Use Topics   Alcohol use: No   Drug use: No   Allergies Bee pollen, Citrus, Pollen extract, Shellfish allergy, Iodine, and Pimecrolimus  Review of Systems Review of Systems  Respiratory:  Positive for shortness of breath.   Cardiovascular:  Positive for chest pain and palpitations.    Physical Exam Vital Signs  I have reviewed the triage vital  signs BP 115/85   Pulse (!) 108   Temp 97.9 F (36.6 C) (Oral)   Resp 20   Ht 5\' 3"  (1.6 m)   Wt 99.8 kg   LMP 02/14/2023 (Approximate)   SpO2 100%   BMI 38.97 kg/m   Physical Exam Vitals and nursing note reviewed.  Constitutional:      General: She is not in acute distress.    Appearance: She is well-developed.  HENT:     Head: Normocephalic and atraumatic.  Eyes:     Conjunctiva/sclera: Conjunctivae normal.  Cardiovascular:     Rate and Rhythm: Normal rate and regular rhythm.     Heart sounds: No murmur heard. Pulmonary:     Effort: Pulmonary effort is normal. No respiratory distress.     Breath sounds: Normal breath sounds.  Abdominal:     Palpations: Abdomen is soft.     Tenderness: There is no abdominal tenderness.  Musculoskeletal:        General: No swelling.     Cervical back: Neck supple.  Skin:    General: Skin is warm and dry.     Capillary Refill: Capillary refill takes less than 2 seconds.  Neurological:     Mental Status: She is alert.  Psychiatric:        Mood and Affect: Mood normal.     ED Results and Treatments Labs (all labs ordered are listed, but only abnormal results are displayed) Labs Reviewed  COMPREHENSIVE METABOLIC PANEL - Abnormal; Notable for the following components:      Result Value   Glucose, Bld 103 (*)    Alkaline Phosphatase 35 (*)    All other components within normal limits  CBC WITH DIFFERENTIAL/PLATELET  D-DIMER, QUANTITATIVE  TROPONIN I (HIGH SENSITIVITY)  TROPONIN I (HIGH SENSITIVITY)                                                                                                                          Radiology No results found.  Pertinent labs & imaging results that were available during my care of the patient were reviewed by me and considered in my medical decision making (see MDM for details).  Medications Ordered in ED Medications -  No data to display                                                                                                                                    Procedures Procedures  (including critical care time)  Medical Decision Making / ED Course   This patient presents to the ED for concern of palpitations, chest pain, shortness of breath, this involves an extensive number of treatment options, and is a complaint that carries with it a high risk of complications and morbidity.  The differential diagnosis includes orthostasis, arrhythmia, electrolyte abnormality, dehydration, anxiety, PE, hypnopompic hallucinations  MDM: Patient seen emergency room for evaluation of palpitations, chest pain and shortness of breath.  Physical exam is unremarkable and although initial presenting heart rate was 108, on my evaluation patient's heart rate was in the 80s.  Laboratory evaluation is reassuringly unremarkable including negative high-sensitivity troponin and negative D-dimer.  Patient is asymptomatic here in the emergency department and her heart score is less than 3.  Low suspicion for ACS at this time.  At this time, patient does not meet inpatient criteria for admission and she will be prescribed a as needed prescription for Atarax to take during these episodes to see if her symptoms may be related to anxiety and panic.  She will also keep a log of her water intake and increase her water intake to see if this is a manifestation of orthostatic hypotension driving transient tachycardia.  She is given return precautions which she voiced understanding she was discharged with outpatient follow-up.   Additional history obtained: -Additional history obtained from mother -External records from outside source obtained and reviewed including: Chart review including previous notes, labs, imaging, consultation notes   Lab Tests: -I ordered, reviewed, and interpreted labs.   The pertinent results include:   Labs Reviewed  COMPREHENSIVE METABOLIC PANEL - Abnormal; Notable for the following  components:      Result Value   Glucose, Bld 103 (*)    Alkaline Phosphatase 35 (*)    All other components within normal limits  CBC WITH DIFFERENTIAL/PLATELET  D-DIMER, QUANTITATIVE  TROPONIN I (HIGH SENSITIVITY)  TROPONIN I (HIGH SENSITIVITY)    Medicines ordered and prescription drug management: Meds ordered this encounter  Medications   hydrOXYzine (ATARAX) 25 MG tablet    Sig: Take 1 tablet (25 mg total) by mouth every 6 (six) hours.    Dispense:  12 tablet    Refill:  0    -I have reviewed the patients home medicines and have made adjustments as needed  Critical interventions none    Cardiac Monitoring: The patient was maintained on a cardiac monitor.  I personally viewed and interpreted the cardiac monitored which showed an underlying rhythm of: NSR, no ST elevations or deppressions, no evidence of dysrhythmia, WPW or Brugada  Social Determinants of Health:  Factors impacting patients care include: none   Reevaluation: After the  interventions noted above, I reevaluated the patient and found that they have :improved  Co morbidities that complicate the patient evaluation  Past Medical History:  Diagnosis Date   Asthma       Dispostion: I considered admission for this patient, but at this time she does not meet inpatient criteria for admission and she is safe for discharge with outpatient follow-up     Final Clinical Impression(s) / ED Diagnoses Final diagnoses:  Tachycardia     @PCDICTATION @    Glendora Score, MD 03/10/23 [phone]

## 2023-03-15 DIAGNOSIS — J309 Allergic rhinitis, unspecified: Secondary | ICD-10-CM | POA: Diagnosis not present

## 2023-03-15 DIAGNOSIS — R7303 Prediabetes: Secondary | ICD-10-CM | POA: Diagnosis not present

## 2023-03-15 DIAGNOSIS — F411 Generalized anxiety disorder: Secondary | ICD-10-CM | POA: Diagnosis not present

## 2023-03-15 DIAGNOSIS — Z0001 Encounter for general adult medical examination with abnormal findings: Secondary | ICD-10-CM | POA: Diagnosis not present

## 2023-03-15 DIAGNOSIS — E559 Vitamin D deficiency, unspecified: Secondary | ICD-10-CM | POA: Diagnosis not present

## 2023-03-15 DIAGNOSIS — Z6839 Body mass index (BMI) 39.0-39.9, adult: Secondary | ICD-10-CM | POA: Diagnosis not present

## 2023-03-30 DIAGNOSIS — J4520 Mild intermittent asthma, uncomplicated: Secondary | ICD-10-CM | POA: Diagnosis not present

## 2023-03-30 DIAGNOSIS — J3089 Other allergic rhinitis: Secondary | ICD-10-CM | POA: Diagnosis not present

## 2023-03-30 DIAGNOSIS — J3081 Allergic rhinitis due to animal (cat) (dog) hair and dander: Secondary | ICD-10-CM | POA: Diagnosis not present

## 2023-03-30 DIAGNOSIS — J301 Allergic rhinitis due to pollen: Secondary | ICD-10-CM | POA: Diagnosis not present

## 2023-04-14 DIAGNOSIS — F320 Major depressive disorder, single episode, mild: Secondary | ICD-10-CM | POA: Diagnosis not present

## 2023-04-18 DIAGNOSIS — F320 Major depressive disorder, single episode, mild: Secondary | ICD-10-CM | POA: Diagnosis not present

## 2023-04-26 DIAGNOSIS — F320 Major depressive disorder, single episode, mild: Secondary | ICD-10-CM | POA: Diagnosis not present

## 2023-04-28 ENCOUNTER — Other Ambulatory Visit: Payer: Self-pay | Admitting: Oncology

## 2023-04-28 DIAGNOSIS — Z006 Encounter for examination for normal comparison and control in clinical research program: Secondary | ICD-10-CM

## 2023-05-03 DIAGNOSIS — F320 Major depressive disorder, single episode, mild: Secondary | ICD-10-CM | POA: Diagnosis not present

## 2023-05-10 DIAGNOSIS — F320 Major depressive disorder, single episode, mild: Secondary | ICD-10-CM | POA: Diagnosis not present

## 2023-06-22 ENCOUNTER — Other Ambulatory Visit (HOSPITAL_COMMUNITY): Payer: Self-pay

## 2023-06-22 MED ORDER — MONTELUKAST SODIUM 10 MG PO TABS
10.0000 mg | ORAL_TABLET | Freq: Every day | ORAL | 3 refills | Status: DC
  Filled 2023-06-22 – 2023-08-23 (×2): qty 90, 90d supply, fill #0

## 2023-06-27 ENCOUNTER — Emergency Department (HOSPITAL_BASED_OUTPATIENT_CLINIC_OR_DEPARTMENT_OTHER)
Admission: EM | Admit: 2023-06-27 | Discharge: 2023-06-27 | Disposition: A | Discharge status: Home / Self Care | Payer: 59 | Attending: Emergency Medicine | Admitting: Emergency Medicine

## 2023-06-27 ENCOUNTER — Emergency Department (HOSPITAL_BASED_OUTPATIENT_CLINIC_OR_DEPARTMENT_OTHER): Payer: 59 | Admitting: Radiology

## 2023-06-27 ENCOUNTER — Encounter (HOSPITAL_BASED_OUTPATIENT_CLINIC_OR_DEPARTMENT_OTHER): Payer: Self-pay

## 2023-06-27 ENCOUNTER — Other Ambulatory Visit: Payer: Self-pay

## 2023-06-27 DIAGNOSIS — S8991XA Unspecified injury of right lower leg, initial encounter: Secondary | ICD-10-CM | POA: Diagnosis not present

## 2023-06-27 DIAGNOSIS — S99911A Unspecified injury of right ankle, initial encounter: Secondary | ICD-10-CM | POA: Insufficient documentation

## 2023-06-27 DIAGNOSIS — Y9389 Activity, other specified: Secondary | ICD-10-CM | POA: Diagnosis not present

## 2023-06-27 DIAGNOSIS — W010XXA Fall on same level from slipping, tripping and stumbling without subsequent striking against object, initial encounter: Secondary | ICD-10-CM | POA: Insufficient documentation

## 2023-06-27 NOTE — ED Provider Notes (Signed)
[facility]PARTMENT AT [facility] Provider Note   CSN: [id number] Arrival date & time: 06/27/23  1603     History  Chief Complaint  Patient presents with   Ankle Pain    R    Ruth Escobar is a 22 y.o. female.  Patient presents to the emergency department today for evaluation of right lower leg injury.  Patient states that she was chasing her dog when she stepped and felt a pop.  She had pain to her high ankle, calf area.  She was able to ambulate.  No distal numbness or tingling.  She took ibuprofen with some improvement.  No knee or hip pain.       Home Medications Prior to Admission medications   Medication Sig Start Date End Date Taking? Authorizing Provider  albuterol (PROAIR HFA) 108 (90 Base) MCG/ACT inhaler Inhale 2 puffs into the lungs every 6 (six) hours as needed. 03/04/23   Raspet, Noberto Retort, PA-C  albuterol-ipratropium (COMBIVENT) 18-103 MCG/ACT inhaler Inhale 2 puffs into the lungs every 6 (six) hours as needed.    [provider]  beclomethasone (QVAR) 80 MCG/ACT inhaler Inhale 2 puffs into the lungs 2 (two) times daily. 08/08/15   Baxter Hire, MD  cetirizine (ZYRTEC) 10 MG tablet Take 10 mg by mouth daily.    [provider]  desonide (DESOWEN) 0.05 % cream Apply 1 application topically 2 (two) times daily.    [provider]  desoximetasone (TOPICORT) 0.05 % cream Apply topically 2 (two) times daily.    [provider]  fluocinonide cream (LIDEX) 0.05 % Apply 1 application topically 2 (two) times daily.    [provider]  fluticasone (FLONASE) 50 MCG/ACT nasal spray Place 2 sprays into both nostrils daily.    [provider]  hydrocodone-ibuprofen (VICOPROFEN) 5-200 MG tablet Take 1 tablet by mouth every 8 (eight) hours as needed for pain. Patient not taking: Reported on 01/28/2017 01/23/16   Lenn Sink, DPM  hydrocortisone cream 0.5 % Apply 1 application topically as needed for  itching.    [provider]  hydrOXYzine (ATARAX) 25 MG tablet Take 1 tablet (25 mg total) by mouth every 6 (six) hours. 03/10/23   Kommor, Madison, MD  ibuprofen (ADVIL) 800 MG tablet Take 1 tablet (800 mg total) by mouth 3 (three) times daily. 03/04/23   Raspet, Erin K, PA-C  montelukast (SINGULAIR) 10 MG tablet Take 10 mg by mouth at bedtime.    [provider]  montelukast (SINGULAIR) 10 MG tablet Take 1 tablet (10 mg total) by mouth daily. 06/22/23     Norethindrone Acetate-Ethinyl Estrad-FE (BLISOVI 24 FE) 1-20 MG-MCG(24) tablet Blisovi 24 Fe 1 mg-20 mcg (24)/75 mg (4) tablet  TAKE 1 TABLET BY MOUTH EVERY DAY    [provider]  triamcinolone cream (KENALOG) 0.1 % Apply 1 application topically 2 (two) times daily.    [provider]      Allergies    Bee pollen, Citrus, Pollen extract, Shellfish allergy, Iodine, and Pimecrolimus    Review of Systems   Review of Systems  Physical Exam Updated Vital Signs BP 121/73 (BP Location: Right Arm)   Pulse 72   Temp 98.7 F (37.1 C) (Oral)   Resp 18   SpO2 100%  Physical Exam Vitals and nursing note reviewed.  Constitutional:      Appearance: She is well-developed.  HENT:     Head: Normocephalic and atraumatic.  Eyes:  Conjunctiva/sclera: Conjunctivae normal.  Cardiovascular:     Pulses:          Dorsalis pedis pulses are 2+ on the right side and 2+ on the left side.       Posterior tibial pulses are 2+ on the right side and 2+ on the left side.  Musculoskeletal:        General: Tenderness present.     Cervical back: Normal range of motion and neck supple.     Right knee: Normal range of motion. No tenderness.     Right lower leg: Tenderness (Distal) present.     Right ankle: No swelling or deformity. Tenderness present. No base of 5th metatarsal or proximal fibula tenderness. Normal range of motion.     Right foot: Normal range of motion. No tenderness.       Legs:  Skin:    General: Skin  is warm and dry.  Neurological:     Mental Status: She is alert.     Comments: Distal motor, sensation, and vascular intact.      ED Results / Procedures / Treatments   Labs (all labs ordered are listed, but only abnormal results are displayed) Labs Reviewed - No data to display  EKG None  Radiology DG Tibia/Fibula Right  Result Date: 06/27/2023 CLINICAL DATA:  Lower leg injury EXAM: RIGHT TIBIA AND FIBULA - 2 VIEW COMPARISON:  None Available. FINDINGS: There is no evidence of fracture or other focal bone lesions. Soft tissues are unremarkable. IMPRESSION: Negative. Electronically Signed   By: Jasmine Pang M.D.   On: 06/27/2023 17:33    Procedures Procedures    Medications Ordered in ED Medications - No data to display  ED Course/ Medical Decision Making/ A&P    Patient seen and examined. History obtained directly from patient.   Labs/EKG: None ordered  Imaging: Independently reviewed and interpreted.  This included: X-ray of the tib-fib on the right, agree no fracture  Medications/Fluids: None ordered  Most recent vital signs reviewed and are as follows: BP 121/73 (BP Location: Right Arm)   Pulse 72   Temp 98.7 F (37.1 C) (Oral)   Resp 18   SpO2 100%   Initial impression: Ankle sprain or calf muscle injury  Home treatment plan: RICE protocol, Ace wrap, OTC meds.  Offered crutches, ASO, patient declines.  Return instructions discussed with patient: New or worsening symptoms  Follow-up instructions discussed with patient: Follow-up with PCP or orthopedist in 1 week if not improving                                Medical Decision Making Amount and/or Complexity of Data Reviewed Radiology: ordered.   Patient with lower leg injury, ambulatory.  X-rays negative.  Lower extremity is neurovascularly intact.  Normal pulses.  Routine care indicated at this time.        Final Clinical Impression(s) / ED Diagnoses Final diagnoses:  Injury of right ankle,  initial encounter    Rx / DC Orders ED Discharge Orders     None         Renne Crigler, PA-C 06/27/23 2202    Rozelle Logan, DO 06/27/23 2323

## 2023-06-27 NOTE — ED Triage Notes (Signed)
Pt c/o L ankle/ lower leg injury, states that she took her dog out & "stepped wrong on it, slipped, fell, heard pop."  1.5hrs ago, ambulatory since.  400mg  ibuprofen, pain is controlled.   Pt states pain is more in side of calf area, not ankle

## 2023-06-27 NOTE — Discharge Instructions (Signed)
Please read and follow all provided instructions.  Your diagnoses today include:  1. Injury of right ankle, initial encounter     Tests performed today include: An x-ray of your ankle - does NOT show any broken bones Vital signs. See below for your results today.   Medications prescribed:  Please use over-the-counter NSAID medications (ibuprofen, naproxen) or Tylenol (acetaminophen) as directed on the packaging for pain -- as long as you do not have any reasons avoid these medications. Reasons to avoid NSAID medications include: weak kidneys, a history of bleeding in your stomach or gut, or uncontrolled high blood pressure or previous heart attack. Reasons to avoid Tylenol include: liver problems or ongoing alcohol use. Never take more than 4000mg  or 8 Extra strength Tylenol in a 24 hour period.     Take any prescribed medications only as directed.  Home care instructions:  Follow any educational materials contained in this packet Follow R.I.C.E. Protocol: R - rest your injury  I  - use ice on injury without applying directly to skin C - compress injury with bandage or splint E - elevate the injury as much as possible  Follow-up instructions: Please follow-up with your primary care provider if you continue to have significant pain or trouble walking in 1 week. In this case you may have a severe sprain that requires further care.   Return instructions:  Please return if your toes are numb or tingling, appear gray or blue, or you have severe pain (also elevate leg and loosen splint or wrap) Please return to the Emergency Department if you experience worsening symptoms.  Please return if you have any other emergent concerns.  Additional Information:  Your vital signs today were: BP 121/73 (BP Location: Right Arm)   Pulse 72   Temp 98.7 F (37.1 C) (Oral)   Resp 18   SpO2 100%  If your blood pressure (BP) was elevated above 135/85 this visit, please have this repeated by your  doctor within one month. --------------

## 2023-06-27 NOTE — ED Notes (Signed)
Patient reports pain to RLE after stepping wrong on it while walking her dog. Mild swelling noted to outside of right ankle. Patient able to move RLE WNL, reports increased pain with ambulation.

## 2023-07-04 ENCOUNTER — Other Ambulatory Visit (HOSPITAL_COMMUNITY): Payer: Self-pay

## 2023-07-26 ENCOUNTER — Ambulatory Visit (HOSPITAL_COMMUNITY)
Admission: EM | Admit: 2023-07-26 | Discharge: 2023-07-26 | Disposition: A | Discharge status: Home / Self Care | Payer: 59 | Attending: Psychiatry | Admitting: Psychiatry

## 2023-07-26 DIAGNOSIS — F320 Major depressive disorder, single episode, mild: Secondary | ICD-10-CM | POA: Diagnosis not present

## 2023-07-26 DIAGNOSIS — F321 Major depressive disorder, single episode, moderate: Secondary | ICD-10-CM

## 2023-07-26 NOTE — ED Provider Notes (Signed)
Behavioral Health Urgent Care Medical Screening Exam  Patient Name: Ruth Escobar MRN: [id number] Date of Evaluation: 07/26/23 Chief Complaint:   Diagnosis:  Final diagnoses:  None    History of Present illness: Ruth Escobar is a 22 y.o. female.  Who presents with a friend as a walk-in.  Patient has female (he/him/his).  Patient reports worsening of depressive symptoms for the past month, loss of motivation, decreased appetite, has lost 10 pounds in a month, feels hopeless, worthless, tired and has increased anxiety.  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  Patient also reports having had thoughts of not being alive 3 days ago, no intent or plan.  She reports that she has been sleeping, getting rest for the past 2 days, has not had any thoughts of ending her life, not wishing to be around.  Patient reports that she is prescribed hydroxyzine by her primary care physician to help with her sleep.  Patient has that she has tried therapy in the past, with online platforms and did not find it beneficial.  Patient denies any history of psychiatric hospitalizations, any suicide attempts, reports that she has good support from her family, friends and that her family lives only 5 minutes away from her.  Patient was willing to bring her friend and who agrees that patient is not a danger to herself, needs to start seeing an outpatient provider to get started on psychotropic medications along with therapy.  Patient denies any psychotic symptoms, any substance use.  Flowsheet Row ED from 07/26/2023 in [facility] ED from 06/27/2023 in [facility] Emergency Department at [facility] ED from 03/04/2023 in [facility] Urgent Care at [facility] RISK CATEGORY No Risk No Risk No Risk       Psychiatric Specialty Exam  Presentation  General  Appearance:Appropriate for Environment; Casual; Well Groomed  Eye Contact:Fair  Speech:Clear and Coherent; Normal Rate  Speech Volume:Normal  Handedness:Right   Mood and Affect  Mood:No data recorded Affect: Congruent; Full Range   Thought Process  Thought Processes: Goal Directed  Descriptions of Associations:Intact  Orientation:Full (Time, Place and Person)  Thought Content:Logical; Rumination    Hallucinations:None  Ideas of Reference:None  Suicidal Thoughts:No  Homicidal Thoughts:No   Sensorium  Memory: Immediate Fair; Recent Fair; Remote Fair  Judgment: Intact  Insight: Fair   Chartered certified accountant: Fair  Attention Span: Fair  Recall: [employer] of Knowledge: Fair  Language: Fair   Psychomotor Activity  Psychomotor Activity:No data recorded  Assets  Assets: Communication Skills; Physical Health; Social Support; Transportation   Sleep  Sleep: Fair  Number of hours: No data recorded  Physical Exam: Physical Exam Constitutional:      Appearance: Normal appearance.  HENT:     Head: Normocephalic and atraumatic.     Nose: Nose normal.  Eyes:     Extraocular Movements: Extraocular movements intact.     Conjunctiva/sclera: Conjunctivae normal.     Pupils: Pupils are equal, round, and reactive to light.  Cardiovascular:     Rate and Rhythm: Normal rate and regular rhythm.     Pulses: Normal pulses.     Heart sounds: Normal heart sounds.  Pulmonary:     Effort: Pulmonary effort is normal.     Breath sounds: Normal breath sounds.  Musculoskeletal:  General: Normal range of motion.     Cervical back: Normal range of motion and neck supple.  Skin:    General: Skin is warm and dry.  Neurological:     General: No focal deficit present.     Mental Status: She is alert and oriented to person, place, and time. Mental status is at baseline.     Cranial Nerves: No cranial nerve deficit.     Motor: No  weakness.     Gait: Gait normal.  Psychiatric:        Behavior: Behavior normal.        Thought Content: Thought content normal.    ROS Blood pressure 123/86, pulse 72, temperature 97.9 F (36.6 C), temperature source Oral, resp. rate 18, SpO2 100%. There is no height or weight on file to calculate BMI.  Musculoskeletal: Strength & Muscle Tone: within normal limits Gait & Station: normal Patient leans: N/A   [facility] MSE Discharge Disposition for Follow up and Recommendations: Based on my evaluation the patient does not appear to have an emergency medical condition and can be discharged with resources and follow up care in outpatient services for Medication Management  Patient has an appointment scheduled at [facility] behavioral health outpatient, [facility] with Hillery Jacks at 4 PM on 08/01/2023  Crisis and safety planning done in length with patient prior to discharge.  Also discussed sleep hygiene as patient reports  decreased sleep, dysregulates her mood.  Patient does have major depressive disorder, discussed coping skills, mechanisms, suicide hotline information   Nelly Rout, MD 07/26/2023, 1:25 PM

## 2023-07-26 NOTE — Progress Notes (Addendum)
   07/26/23 1107  [facility] Triage Screening (Walk-ins at [facility] only)  How Did You Hear About [place]? Primary Care  What Is the Reason for Your Visit/Call Today? Ruth Escobar "[name]" is a 22 year old female who identifies as female (he/him/his)who presents to [facility] accompanied by her friend due to worsening depression symptoms for the past month. Pt reports loss of motivation, decreased appetite, lost 10lbs in 1 month,hopelessness, worthlessness, fatigue, unstable sleeping patterns, increased anxiety. 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 Pt reports she stopped therapy in September due to issues with insurance but is interested in finding a new therapist. Pt reports she is diagnosed with anxiety and is precribed Hydroxyzine by her PCP, Andi Devon, at [facility]. Pt denies past suicide attempts or psychiatric hospitalizations.  Pt reports she lives alone. She reports having support from her parents and friends. She is seeking recommendations for psychiatry and therapy services. Pt denies SI/HI and AVH.  How Long Has This Been Causing You Problems? 1 wk - 1 month  Have You Recently Had Any Thoughts About Hurting Yourself? No  Are You Planning to Commit Suicide/Harm Yourself At This time? No  Have you Recently Had Thoughts About Hurting Someone [name]? No  Are You Planning To Harm Someone At This Time? No  Are you currently experiencing any auditory, visual or other hallucinations? No  Have You Used Any Alcohol or Drugs in the Past 24 Hours? No  Do you have any current medical co-morbidities that require immediate attention? No  Clinician description of patient physical appearance/behavior: calm , cooperative, well groomed  What Do You Feel Would Help You the Most Today? Treatment for Depression or other mood problem;Medication(s)  If access to [facility] was not available, would you have sought care in the Emergency  Department? No  Determination of Need Routine (7 days)  Options For Referral Outpatient Therapy;Medication Management

## 2023-07-26 NOTE — Discharge Instructions (Addendum)
Please call [phone] to confirm appointment. You have an appointment with Ruth Escobar at [address], [place], [place] [address] at 3 P.M on 08/01/2023

## 2023-08-01 ENCOUNTER — Other Ambulatory Visit: Payer: Self-pay

## 2023-08-01 ENCOUNTER — Ambulatory Visit (HOSPITAL_BASED_OUTPATIENT_CLINIC_OR_DEPARTMENT_OTHER): Payer: 59 | Admitting: Family

## 2023-08-01 ENCOUNTER — Encounter (HOSPITAL_COMMUNITY): Payer: Self-pay | Admitting: Family

## 2023-08-01 VITALS — Ht 63.0 in | Wt 220.0 lb

## 2023-08-01 DIAGNOSIS — F4323 Adjustment disorder with mixed anxiety and depressed mood: Secondary | ICD-10-CM

## 2023-08-01 MED ORDER — BUPROPION HCL ER (XL) 150 MG PO TB24: 150.0000 mg | ORAL_TABLET | ORAL | 2 refills | Status: DC

## 2023-08-01 NOTE — Progress Notes (Signed)
Psychiatric Initial Adult Assessment   Patient Identification: Ruth Escobar MRN:  [id number] Date of Evaluation:  08/01/2023 Referral Source: [facility]  Chief Complaint: Worsening depression and anxiety  Visit Diagnosis:    ICD-10-CM   1. Adjustment disorder with mixed anxiety and depressed mood  F43.23       History of Present Illness:  Ruth "[name]"  Escobar is a 22 year old prefers female pronouns.  Reports currently seeking to legally change his name.  Reports he was under a lot of stress when he went to [facility] urgent care seeking assistance due to increased suicidal ideations.  He denied plan or intent at that time.  He reports a history of generalized anxiety and major depressive disorder.  He reports multiple stressors contributing to his worsening mood.    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  He reported he is currently in school for radiology however, is currently failing classes.  He reports reports working 3-12-hour shifts which can be stressful and overwhelming by the end of day 3.    Reported feeling overwhelmed, decreased motivation, low energy, poor concentration mood irritability and increased anxiety.  He reported passive thoughts suicidal ideations denied plan or intent.,  Denied that it he has ever been followed by psychiatry services for medication management.  States he was followed by a therapist through a virtual platform [employer] [facility] and then he stated that he followed therapist Claudine Mouton through a different platform [facility] therapy services.    " Ruth Escobar" reports ongoing symptoms of worry related to parents not being supportive of his transgender status. "  I feel like I am living a lot when I am around my parents."  Reports he was in the relationship however they recently broke up and they had been together for roughly a year and a half.  Reports that they  still have friends in common, which has been stressful so he decided to isolate his self from everyone.   Denied illicit drug use or substance abuse history.  Denied previous inpatient admissions.  Denied auditory or visual hallucinations.  PHQ-9 is 18 GAD-7 is 8.  Reported previous suicide attempt 66 or 22 years old.  Denied study followed by therapy or psychiatrist at that time.  Denied a history related to physical or sexual abuse in the past.  Denied seizure disorder or migraines.  Adjustment disorder mixed depression anxiety: Has hydroxyzine available 25 mg  as needed 3 times daily for sleep and anxiety -Initiated Wellbutrin daily  [name] is sitting ; "he"  is alert/oriented x 4; calm/cooperative; and mood congruent with affect.  Patient is speaking in a clear tone at moderate volume, and normal pace; with good eye contact. His  thought process is coherent and relevant; There is no indication that he is currently responding to internal/external stimuli or experiencing delusional thought content.  Patient denies suicidal/self-harm/homicidal ideation, psychosis, and paranoia.  Patient has remained calm throughout assessment and has answered questions appropriately.   Associated Signs/Symptoms: Depression Symptoms:  depressed mood, difficulty concentrating, anxiety, panic attacks, (Hypo) Manic Symptoms:  Distractibility, Anxiety Symptoms:  Excessive Worry, Psychotic Symptoms:  Hallucinations: None PTSD Symptoms: Avoidance:  Decreased Interest/Participation  Past Psychiatric History:   Previous Psychotropic Medications: No   Substance Abuse History in the last 12 months:  No.  Consequences of Substance Abuse: NA  Past Medical History:  Past Medical History:  Diagnosis Date  Asthma    History reviewed. No pertinent surgical history.  Family Psychiatric History: Unknown, patient reported that 'his' mother was taken medication for anxiety, however,  has recently  discontinued medication and is not forthcoming with medical history.  Reports he has a older sibling.  Reports a pretty good relationship between the both of them.  Family History:  Family History  Problem Relation Age of Onset   Cancer Mother    Cancer Father    Hypertension Father     Social History:   Social History   Socioeconomic History   Marital status: Single    Spouse name: Not on file   Number of children: Not on file   Years of education: Not on file   Highest education level: Not on file  Occupational History   Not on file  Tobacco Use   Smoking status: Never   Smokeless tobacco: Never  Vaping Use   Vaping status: Never Used  Substance and Sexual Activity   Alcohol use: No   Drug use: No   Sexual activity: Not on file  Other Topics Concern   Not on file  Social History Narrative   Not on file   Social Determinants of Health   Financial Resource Strain: Not on file  Food Insecurity: Not on file  Transportation Needs: Not on file  Physical Activity: Not on file  Stress: Not on file  Social Connections: Not on file    Additional Social History:   Allergies:   Allergies  Allergen Reactions   Bee Pollen     Other reaction(s): Cough   Citrus Rash   Pollen Extract Cough   Shellfish Allergy Anaphylaxis   Iodine    Pimecrolimus Rash and Other (See Comments)    Other Reaction(s): Other (See Comments)  rash  Other reaction(s): Unknown  rash    Metabolic Disorder Labs: No results found for: "HGBA1C", "MPG" No results found for: "PROLACTIN" No results found for: "CHOL", "TRIG", "HDL", "CHOLHDL", "VLDL", "LDLCALC" No results found for: "TSH"  Therapeutic Level Labs: No results found for: "LITHIUM" No results found for: "CBMZ" No results found for: "VALPROATE"  Current Medications: Current Outpatient Medications  Medication Sig Dispense Refill   albuterol (PROAIR HFA) 108 (90 Base) MCG/ACT inhaler Inhale 2 puffs into the lungs every 6 (six)  hours as needed. 18 g 0   albuterol-ipratropium (COMBIVENT) 18-103 MCG/ACT inhaler Inhale 2 puffs into the lungs every 6 (six) hours as needed.     buPROPion (WELLBUTRIN XL) 150 MG 24 hr tablet Take 1 tablet (150 mg total) by mouth every morning. 30 tablet 2   cetirizine (ZYRTEC) 10 MG tablet Take 10 mg by mouth daily.     fluticasone (FLONASE) 50 MCG/ACT nasal spray Place 2 sprays into both nostrils daily.     hydrOXYzine (ATARAX) 25 MG tablet Take 1 tablet (25 mg total) by mouth every 6 (six) hours. 12 tablet 0   montelukast (SINGULAIR) 10 MG tablet Take 1 tablet (10 mg total) by mouth daily. 90 tablet 3   Norethindrone Acetate-Ethinyl Estrad-FE (BLISOVI 24 FE) 1-20 MG-MCG(24) tablet Blisovi 24 Fe 1 mg-20 mcg (24)/75 mg (4) tablet  TAKE 1 TABLET BY MOUTH EVERY DAY     beclomethasone (QVAR) 80 MCG/ACT inhaler Inhale 2 puffs into the lungs 2 (two) times daily. (Patient not taking: Reported on 08/01/2023) 1 Inhaler 3   No current facility-administered medications for this visit.    Musculoskeletal: Strength & Muscle Tone: within normal limits Gait & Station:  normal Patient leans: N/A  Psychiatric Specialty Exam: Review of Systems  Height 5\' 3"  (1.6 m), weight 220 lb (99.8 kg).Body mass index is 38.97 kg/m.  General Appearance: Casual  Eye Contact:  Good  Speech:  Clear and Coherent  Volume:  Normal  Mood:  Anxious and Depressed  Affect:  Congruent  Thought Process:  Coherent  Orientation:  Full (Time, Place, and Person)  Thought Content:  Logical  Suicidal Thoughts:  No  Homicidal Thoughts:  No  Memory:  Immediate;   Good Recent;   Good  Judgement:  Fair  Insight:  Good  Psychomotor Activity:  Normal  Concentration:  Concentration: Good  Recall:  Good  Fund of Knowledge:Good  Language: Good  Akathisia:  No  Handed:  Right  AIMS (if indicated):  not done  Assets:  Desire for Improvement Social Support  ADL's:  Intact  Cognition: WNL  Sleep:  Good    Screenings: Flowsheet Row ED from 07/26/2023 in [facility] ED from 06/27/2023 in [facility] Emergency Department [facility] ED from 03/09/2023 in [facility] Emergency Department at [facility]  C-SSRS RISK CATEGORY No Risk No Risk No Risk       Assessment and Plan: Ranata "[name]" Pawlowicz 22 year old transgender person presents to establish care.  Reports experiencing passive suicidal ideation and presented to the different [place] urgent care for medication management and additional support.  Currently denying suicidal ideations at this visit.  Continues to endorse increased anxiety, worsening depression mood irritability social isolation low energy and poor concentration.  Denied illicit drug use or substance abuse history.  Denied previous inpatient admissions.  Reports he was followed by therapy services but has not had the energy to follow-up with anyone at this time.  Patient's amendable with initiating Wellbutrin 150 mg daily, continue hydroxyzine 25 mg as needed  -Follow-up 4 weeks for medication adherence/tolerability  Collaboration of Care: Medication Management AEB Wellbutrin and hydroxyzine  Patient/Guardian was advised Release of Information must be obtained prior to any record release in order to collaborate their care with an outside provider. Patient/Guardian was advised if they have not already done so to contact the registration department to sign all necessary forms in order for [place] to release information regarding their care.   Consent: Patient/Guardian gives verbal consent for treatment and assignment of benefits for services provided during this visit. Patient/Guardian expressed understanding and agreed to proceed.   Oneta Rack, NP 11/11/20244:30 PM

## 2023-08-10 ENCOUNTER — Other Ambulatory Visit: Payer: Self-pay

## 2023-08-10 ENCOUNTER — Emergency Department (HOSPITAL_BASED_OUTPATIENT_CLINIC_OR_DEPARTMENT_OTHER): Payer: 59

## 2023-08-10 ENCOUNTER — Encounter (HOSPITAL_BASED_OUTPATIENT_CLINIC_OR_DEPARTMENT_OTHER): Payer: Self-pay

## 2023-08-10 ENCOUNTER — Emergency Department (HOSPITAL_BASED_OUTPATIENT_CLINIC_OR_DEPARTMENT_OTHER)
Admission: EM | Admit: 2023-08-10 | Discharge: 2023-08-10 | Disposition: A | Discharge status: Home / Self Care | Payer: 59 | Attending: Emergency Medicine | Admitting: Emergency Medicine

## 2023-08-10 DIAGNOSIS — S93491A Sprain of other ligament of right ankle, initial encounter: Secondary | ICD-10-CM | POA: Diagnosis not present

## 2023-08-10 DIAGNOSIS — X501XXA Overexertion from prolonged static or awkward postures, initial encounter: Secondary | ICD-10-CM | POA: Insufficient documentation

## 2023-08-10 DIAGNOSIS — Y92481 Parking lot as the place of occurrence of the external cause: Secondary | ICD-10-CM | POA: Diagnosis not present

## 2023-08-10 DIAGNOSIS — J45909 Unspecified asthma, uncomplicated: Secondary | ICD-10-CM | POA: Diagnosis not present

## 2023-08-10 DIAGNOSIS — S99911A Unspecified injury of right ankle, initial encounter: Secondary | ICD-10-CM | POA: Diagnosis present

## 2023-08-10 DIAGNOSIS — S93431A Sprain of tibiofibular ligament of right ankle, initial encounter: Secondary | ICD-10-CM | POA: Diagnosis not present

## 2023-08-10 DIAGNOSIS — M25571 Pain in right ankle and joints of right foot: Secondary | ICD-10-CM | POA: Diagnosis not present

## 2023-08-10 MED ORDER — IBUPROFEN 400 MG PO TABS
400.0000 mg | ORAL_TABLET | Freq: Once | ORAL | Status: AC | PRN
  Administered 2023-08-10: 400 mg via ORAL
  Filled 2023-08-10: qty 1

## 2023-08-10 NOTE — ED Provider Notes (Signed)
[facility] EMERGENCY DEPARTMENT AT [facility] Provider Note   CSN: [id number] Arrival date & time: 08/10/23  1857     History  Chief Complaint  Patient presents with   Ankle Injury    Ruth Escobar is a 22 y.o. adult.  Patient is a 22 year old female with a history of asthma presenting today with an injury to her right ankle.  She reports she was walking across the [employer] parking lot when she slipped on the wet ground and twisted her ankle.  It is very tender when she tries to walk on it and swollen.  She reports spraining her ankle about a month ago which had completely healed.  She denies any injury anywhere else.  She did get some ibuprofen earlier and reports it has helped some with the pain but she still has significant pain when trying to bear weight.  The history is provided by the patient.  Ankle Injury       Home Medications Prior to Admission medications   Medication Sig Start Date End Date Taking? Authorizing Provider  albuterol (PROAIR HFA) 108 (90 Base) MCG/ACT inhaler Inhale 2 puffs into the lungs every 6 (six) hours as needed. 03/04/23   Raspet, Noberto Retort, PA-C  albuterol-ipratropium (COMBIVENT) 18-103 MCG/ACT inhaler Inhale 2 puffs into the lungs every 6 (six) hours as needed.    [provider]  beclomethasone (QVAR) 80 MCG/ACT inhaler Inhale 2 puffs into the lungs 2 (two) times daily. Patient not taking: Reported on 08/01/2023 08/08/15   Baxter Hire, MD  buPROPion (WELLBUTRIN XL) 150 MG 24 hr tablet Take 1 tablet (150 mg total) by mouth every morning. 08/01/23 07/31/24  Oneta Rack, NP  cetirizine (ZYRTEC) 10 MG tablet Take 10 mg by mouth daily.    [provider]  fluticasone (FLONASE) 50 MCG/ACT nasal spray Place 2 sprays into both nostrils daily.    [provider]  hydrOXYzine (ATARAX) 25 MG tablet Take 1 tablet (25 mg total) by mouth every 6 (six) hours. 03/10/23   Kommor, Madison, MD  montelukast  (SINGULAIR) 10 MG tablet Take 1 tablet (10 mg total) by mouth daily. 06/22/23     Norethindrone Acetate-Ethinyl Estrad-FE (BLISOVI 24 FE) 1-20 MG-MCG(24) tablet Blisovi 24 Fe 1 mg-20 mcg (24)/75 mg (4) tablet  TAKE 1 TABLET BY MOUTH EVERY DAY    [provider]      Allergies    Bee pollen, Citrus, Pollen extract, Shellfish allergy, Iodine, and Pimecrolimus    Review of Systems   Review of Systems  Physical Exam Updated Vital Signs BP 109/74 (BP Location: Left Arm)   Pulse 77   Temp 99 F (37.2 C) (Oral)   Resp 16   Ht 5\' 3"  (1.6 m)   Wt 99.8 kg   LMP 08/01/2023   SpO2 100%   BMI 38.97 kg/m  Physical Exam Vitals and nursing note reviewed.  Constitutional:      Appearance: Normal appearance.  Cardiovascular:     Rate and Rhythm: Normal rate.  Pulmonary:     Effort: Pulmonary effort is normal.  Musculoskeletal:        General: Tenderness present.     Right ankle: Tenderness present over the lateral malleolus, ATF ligament and base of 5th metatarsal. No proximal fibula tenderness. Decreased range of motion.     Right Achilles Tendon: Normal.  Skin:    General: Skin is warm.  Neurological:     Mental Status: He is alert. Mental status  is at baseline.     ED Results / Procedures / Treatments   Labs (all labs ordered are listed, but only abnormal results are displayed) Labs Reviewed - No data to display  EKG None  Radiology DG Ankle Right Port  Result Date: 08/10/2023 CLINICAL DATA:  Pain after fall.  Felt a pop. EXAM: PORTABLE RIGHT ANKLE - 2 VIEW COMPARISON:  None Available. FINDINGS: There is no evidence of fracture, dislocation, or joint effusion. There is no evidence of arthropathy or other focal bone abnormality. Soft tissue edema is more prominent laterally. IMPRESSION: Soft tissue edema. No fracture or subluxation of the right ankle. Electronically Signed   By: Narda Rutherford M.D.   On: 08/10/2023 21:47    Procedures Procedures    Medications  Ordered in ED Medications  ibuprofen (ADVIL) tablet 400 mg (400 mg Oral Given 08/10/23 2004)    ED Course/ Medical Decision Making/ A&P                                 Medical Decision Making Amount and/or Complexity of Data Reviewed Radiology: ordered and independent interpretation performed. Decision-making details documented in ED Course.  Risk Prescription drug management.   Patient presenting today after a fall with concern for ankle sprain versus fracture.  No symptoms suggestive of Maisonneuve fracture.  No other injury.  I have independently visualized and interpreted pt's images today.  No evidence of acute fracture today.  Radiology reports soft tissue swelling.  Patient placed in ASO and given crutches.  Supportive care and follow-up recommended.         Final Clinical Impression(s) / ED Diagnoses Final diagnoses:  Sprain of anterior talofibular ligament of right ankle, initial encounter    Rx / DC Orders ED Discharge Orders     None         Gwyneth Sprout, MD 08/10/23 2220

## 2023-08-10 NOTE — ED Triage Notes (Signed)
Pt states she slipped while walking on wet concrete, c/o right ankle pain.

## 2023-08-10 NOTE — ED Triage Notes (Signed)
Pt via ems from home; states she fell in parking lot; landed on her right ankle, felt it pop. States she is unable to ambulate on it. Swelling present.  Pt A&O x 4; nad noted.

## 2023-08-10 NOTE — ED Notes (Signed)
Reviewed AVS with patient, patient expressed understanding of directions, denies further questions at this time. 

## 2023-08-10 NOTE — Discharge Instructions (Addendum)
No broken bones but badly sprained.  Wear the brace until he gets better.  Use crutches as needed.  Ice and elevate for the next few days take Tylenol and ibuprofen as needed for the pain.

## 2023-08-16 ENCOUNTER — Ambulatory Visit (INDEPENDENT_AMBULATORY_CARE_PROVIDER_SITE_OTHER): Payer: 59 | Admitting: Licensed Clinical Social Worker

## 2023-08-16 ENCOUNTER — Encounter (HOSPITAL_COMMUNITY): Payer: Self-pay

## 2023-08-16 DIAGNOSIS — F4323 Adjustment disorder with mixed anxiety and depressed mood: Secondary | ICD-10-CM | POA: Diagnosis not present
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
## 2023-08-23 ENCOUNTER — Other Ambulatory Visit (HOSPITAL_COMMUNITY): Payer: Self-pay | Admitting: Family

## 2023-08-23 ENCOUNTER — Other Ambulatory Visit (HOSPITAL_COMMUNITY): Payer: Self-pay

## 2023-08-23 ENCOUNTER — Encounter (HOSPITAL_COMMUNITY): Payer: Self-pay | Admitting: Family

## 2023-08-23 ENCOUNTER — Other Ambulatory Visit: Payer: Self-pay

## 2023-08-23 ENCOUNTER — Ambulatory Visit (HOSPITAL_BASED_OUTPATIENT_CLINIC_OR_DEPARTMENT_OTHER): Payer: 59 | Admitting: Family

## 2023-08-23 VITALS — BP 113/75 | HR 103 | Ht 63.0 in | Wt 220.0 lb

## 2023-08-23 DIAGNOSIS — F4323 Adjustment disorder with mixed anxiety and depressed mood: Secondary | ICD-10-CM

## 2023-08-23 NOTE — Progress Notes (Signed)
BH MD/PA/NP OP Progress Note  08/23/2023 3:13 PM Ruth Escobar  MRN:  [id number]  Chief Complaint: Ruth Escobar stated " I am feeling better today."   HPI: Ruth Escobar "Ruth Escobar" presents for medical patient management appointment.  Ruth Escobar carries a diagnosis with adjustment disorder with mixed anxiety and depressed mood.  Ruth Escobar was initiated on Wellbutrin and hydroxyzine at previous visit.  Ruth Escobar report she has been taking and tolerating medications well.  Denying any medication side effects.  Continues to endorse ongoing work-related stressors.  Ruth Escobar is attributing her stressors related to" rumor mail at work" no concerns related to suicidal or homicidal ideations.  Reports a good appetite.  States she is resting well throughout the night.  Rating her depression and anxiety 5 out of 10 with 10 being the worst.  Reports she has been in contact with her professor, and I am going to fill this class I am too far and it to withdrawal at this point.  Denied that Wellbutrin is helping with concentration and/or focus.  Discussed following up with [place] attention specialist and/or [facility] for ADHD adult testing.  Ruth Escobar was receptive to plan.  Support, encouragement reassurance was provided.  Adjustment disorder: Mixed mood depression and anxiety: Continue Wellbutrin 150 mg daily Continue hydroxyzine 25 to 50 mg p.o. 3 times daily as needed  Consideration for following up with [place] attention specialist and/or [facility] for ADHD testing Follow-up 3 months for medication management.  No documented concerns related to suicidal ideations.  Patient is alert oriented x 4 calm cooperative and congruent with affect.  Speaking in clear tone moderate pace and normal volume.  Relevant clear coherent conversation.  No indication that patient is responding to internal or external stimuli.  Patient remained calm throughout this assessment.    Visit Diagnosis:    ICD-10-CM   1. Adjustment disorder with mixed  anxiety and depressed mood  F43.23       Past Psychiatric History:   Past Medical History:  Past Medical History:  Diagnosis Date   Asthma    History reviewed. No pertinent surgical history.  Family Psychiatric History:   Family History:  Family History  Problem Relation Age of Onset   Cancer Mother    Cancer Father    Hypertension Father     Social History:  Social History   Socioeconomic History   Marital status: Single    Spouse name: Not on file   Number of children: Not on file   Years of education: Not on file   Highest education level: Not on file  Occupational History   Not on file  Tobacco Use   Smoking status: Never   Smokeless tobacco: Never  Vaping Use   Vaping status: Never Used  Substance and Sexual Activity   Alcohol use: No   Drug use: No   Sexual activity: Not on file  Other Topics Concern   Not on file  Social History Narrative   Not on file   Social Determinants of Health   Financial Resource Strain: Not on file  Food Insecurity: Not on file  Transportation Needs: Not on file  Physical Activity: Not on file  Stress: Not on file  Social Connections: Not on file    Allergies:  Allergies  Allergen Reactions   Bee Pollen     Other reaction(s): Cough   Citrus Rash   Pollen Extract Cough   Shellfish Allergy Anaphylaxis   Iodine    Pimecrolimus Rash and Other (See Comments)  Other Reaction(s): Other (See Comments)  rash  Other reaction(s): Unknown  rash    Metabolic Disorder Labs: No results found for: "HGBA1C", "MPG" No results found for: "PROLACTIN" No results found for: "CHOL", "TRIG", "HDL", "CHOLHDL", "VLDL", "LDLCALC" No results found for: "TSH"  Therapeutic Level Labs: No results found for: "LITHIUM" No results found for: "VALPROATE" No results found for: "CBMZ"  Current Medications: Current Outpatient Medications  Medication Sig Dispense Refill   albuterol (PROAIR HFA) 108 (90 Base) MCG/ACT inhaler Inhale 2  puffs into the lungs every 6 (six) hours as needed. 18 g 0   albuterol-ipratropium (COMBIVENT) 18-103 MCG/ACT inhaler Inhale 2 puffs into the lungs every 6 (six) hours as needed.     buPROPion (WELLBUTRIN XL) 150 MG 24 hr tablet Take 1 tablet (150 mg total) by mouth every morning. 30 tablet 2   cetirizine (ZYRTEC) 10 MG tablet Take 10 mg by mouth daily.     fluticasone (FLONASE) 50 MCG/ACT nasal spray Place 2 sprays into both nostrils daily.     hydrOXYzine (ATARAX) 25 MG tablet Take 1 tablet (25 mg total) by mouth every 6 (six) hours. 12 tablet 0   montelukast (SINGULAIR) 10 MG tablet Take 1 tablet (10 mg total) by mouth daily. 90 tablet 3   Norethindrone Acetate-Ethinyl Estrad-FE (BLISOVI 24 FE) 1-20 MG-MCG(24) tablet Blisovi 24 Fe 1 mg-20 mcg (24)/75 mg (4) tablet  TAKE 1 TABLET BY MOUTH EVERY DAY     beclomethasone (QVAR) 80 MCG/ACT inhaler Inhale 2 puffs into the lungs 2 (two) times daily. (Patient not taking: Reported on 08/01/2023) 1 Inhaler 3   No current facility-administered medications for this visit.     Musculoskeletal: Strength & Muscle Tone: within normal limits Gait & Station: normal Patient leans: N/A  Psychiatric Specialty Exam: Review of Systems  Psychiatric/Behavioral:  Negative for decreased concentration and sleep disturbance. The patient is nervous/anxious.   All other systems reviewed and are negative.   Blood pressure 113/75, pulse (!) 103, height 5\' 3"  (1.6 m), weight 220 lb (99.8 kg), last menstrual period 08/01/2023.Body mass index is 38.97 kg/m.  General Appearance: Casual  Eye Contact:  Good  Speech:  Clear and Coherent  Volume:  Normal  Mood:  Depressed  Affect:  Congruent  Thought Process:  Coherent  Orientation:  Full (Time, Place, and Person)  Thought Content: Logical   Suicidal Thoughts:  No  Homicidal Thoughts:  No  Memory:  Immediate;   Good Recent;   Good  Judgement:  Good  Insight:  Good  Psychomotor Activity:  Normal  Concentration:   Concentration: Good  Recall:  Good  Fund of Knowledge: Good  Language: Good  Akathisia:  No  Handed:  Right  AIMS (if indicated): not done  Assets:  Communication Skills Desire for Improvement Social Support  ADL's:  Intact  Cognition: WNL  Sleep:  Good   Screenings: GAD-7    Advertising copywriter from 08/16/2023 in [facility] at [facility]  Total GAD-7 Score 5      PHQ2-9    Flowsheet Row Counselor from 08/16/2023 in [facility] at [facility] Visit from 08/01/2023 in [facility]  PHQ-2 Total Score 2 3  PHQ-9 Total Score 5 17      Flowsheet Row Counselor from 08/16/2023 in [facility] ED from 08/10/2023 in [facility] Emergency Department at [facility] Office Visit from 08/01/2023 in [facility]  C-SSRS RISK CATEGORY High Risk No Risk Error: Q7 should not be populated when Q6 is No        Assessment and Plan: Cherita "Michah" presents for medication management follow-up appointment.  No documented concerns with taking medication as indicated.  Denied medication side effects.  Reports she has been taking and tolerating medications well.  No suicidal or homicidal ideations.  No auditory or visual hallucinations.  Denied concerns related to sleep disturbance.  In her depression and anxiety 4 out of 10 with 10 being the worst.  Continues to endorse psychosocial work related stressors.  Patient appears to have a positive outlook on life.  States she has plans to continue with self-care as she is going to go see Wicked the movie for the second time.  Reports a follow-up appointment with orthopedist due to twisted ankle sprain.   Adjustment disorder: Mixed mood depression and anxiety: Continue Wellbutrin 150 mg daily Continue hydroxyzine 25 to 50 mg p.o. 3 times daily as needed  F/u  3 months   Collaboration of Care: Collaboration of Care: Medication Management AEB continue current medication as directed.    Patient/Guardian was advised Release of Information must be obtained prior to any record release in order to collaborate their care with an outside provider. Patient/Guardian was advised if they have not already done so to contact the registration department to sign all necessary forms in order for [place] to release information regarding their care.   Consent: Patient/Guardian gives verbal consent for treatment and assignment of benefits for services provided during this visit. Patient/Guardian expressed understanding and agreed to proceed.    Oneta Rack, NP 08/23/2023, 3:13 PM

## 2023-08-24 DIAGNOSIS — M25571 Pain in right ankle and joints of right foot: Secondary | ICD-10-CM | POA: Diagnosis not present

## 2023-08-30 ENCOUNTER — Ambulatory Visit (INDEPENDENT_AMBULATORY_CARE_PROVIDER_SITE_OTHER): Payer: 59 | Admitting: Licensed Clinical Social Worker

## 2023-08-30 DIAGNOSIS — F4323 Adjustment disorder with mixed anxiety and depressed mood: Secondary | ICD-10-CM

## 2023-08-30 NOTE — Progress Notes (Signed)
THERAPIST PROGRESS NOTE   Session Date: 08/30/2023  Session Time: 1607 - 1657  Participation Level: Active  Behavioral Response: Casual and Well GroomedAlertEuthymic  Type of Therapy: Individual Therapy  Treatment Goals addressed:  - LTG: Reduce frequency, intensity, and duration of depression symptoms so that daily functioning is improved (OP Depression) - LTG: Increase coping skills to manage depression and improve ability to perform daily activities (OP Depression) - STG: Ruth "[name]" will reduce frequency of avoidant behaviors by 50% as evidenced by self-report in therapy sessions (OP Depression) - LTG: "I just want to understand my emotions and triggers for depression better" (OP Depression)  ProgressTowards Goals: Progressing  Interventions: CBT, Motivational Interviewing, and Supportive  Summary: "Ruth Escobar" is a 22 y.o. Trans female with past psych history of Adjustment D/O w/ mixed Anxiety and Depressed mood, presenting for follow-up therapy session in efforts to improve management of depressive and anxious symptoms. Patient actively engaged in session, providing recounts of the past two weeks since intake appt, detailing overall improvements in moods and reduction of sxs. Engaged in re-administration of PHQ9 and GAD7, further exploring reduction in severity of sxs. Continued development of rapport, further exploring pt's identified recent stressors related to workplace, relationships, and varying short and long term goals. Explored pt beginning to journal thoughts and feelings surrounding future plans in efforts to improve abilities at navigating stress. Patient responded well to interventions. Patient continues to meet criteria for Adjustment D/O w/ mixed Anxiety and Depressed mood. Patient will continue to benefit from engagement in outpatient therapy due to being the least restrictive service to meet presenting needs.      08/30/2023    4:13 PM 08/16/2023    1:12 PM  GAD 7 :  Generalized Anxiety Score  Nervous, Anxious, on Edge 0 1  Control/stop worrying 0 0  Worry too much - different things 0 1  Trouble relaxing 1 1  Restless 0 0  Easily annoyed or irritable 1 2  Afraid - awful might happen 0 0  Total GAD 7 Score 2 5  Anxiety Difficulty Somewhat difficult Somewhat difficult      08/30/2023    4:17 PM 08/16/2023    1:07 PM 08/01/2023    4:32 PM  Depression screen PHQ 2/9  Decreased Interest 1 1 1   Down, Depressed, Hopeless 0 1 2  PHQ - 2 Score 1 2 3   Altered sleeping 0 1 2  Tired, decreased energy 0 1 3  Change in appetite 0 0 3  Feeling bad or failure about yourself  1 0 3  Trouble concentrating 1 1 1   Moving slowly or fidgety/restless 0 0 1  Suicidal thoughts 0 0 1  PHQ-9 Score 3 5 17   Difficult doing work/chores Somewhat difficult Not difficult at all Very difficult   Suicidal/Homicidal: No  Therapist Response: Clinician utilized CBT, MI, and supportive reflections to address pt's identified stressors. Engaged pt in processing of recent stressors, reflecting on recent challenges and stressors pt identified in the workplace, past romantic relationships, and recent peer stress. Re-administered PHQ9 and GAD7 in efforts to track severity of sxs and stressors, engaging pt in processing thoughts surrounding continued downward trend in severity scalings. Engaged pt in exploring homework to support pt in tracking thoughts surrounding presenting stressors and challenges in efforts to improve abilities in processing. Therapist provided support and empathy to patient during session.  Plan: Return again in 2 weeks.  Diagnosis:  Encounter Diagnosis  Name Primary?   Adjustment disorder with mixed anxiety and  depressed mood Yes    Collaboration of Care: Other none necessary at this time.  Patient/Guardian was advised Release of Information must be obtained prior to any record release in order to collaborate their care with an outside provider.  Patient/Guardian was advised if they have not already done so to contact the registration department to sign all necessary forms in order for [place] to release information regarding their care.   Consent: Patient/Guardian gives verbal consent for treatment and assignment of benefits for services provided during this visit. Patient/Guardian expressed understanding and agreed to proceed.   [name], MSW, LCSW 08/30/2023,  4:06 PM

## 2023-08-31 ENCOUNTER — Other Ambulatory Visit (HOSPITAL_COMMUNITY): Payer: Self-pay | Attending: Oncology

## 2023-09-05 ENCOUNTER — Other Ambulatory Visit (HOSPITAL_COMMUNITY): Payer: Self-pay

## 2023-09-05 ENCOUNTER — Telehealth (HOSPITAL_COMMUNITY): Payer: Self-pay | Admitting: Family

## 2023-09-05 MED ORDER — BUPROPION HCL ER (XL) 150 MG PO TB24
150.0000 mg | ORAL_TABLET | ORAL | 0 refills | Status: DC
Start: 2023-09-05 — End: 2023-11-08
  Filled 2023-09-05 – 2023-09-28 (×2): qty 90, 90d supply, fill #0

## 2023-09-15 ENCOUNTER — Other Ambulatory Visit (HOSPITAL_COMMUNITY): Payer: Self-pay

## 2023-09-19 ENCOUNTER — Ambulatory Visit (HOSPITAL_COMMUNITY): Payer: 59 | Admitting: Licensed Clinical Social Worker

## 2023-09-19 DIAGNOSIS — F4323 Adjustment disorder with mixed anxiety and depressed mood: Secondary | ICD-10-CM | POA: Diagnosis not present

## 2023-09-19 NOTE — Progress Notes (Signed)
THERAPIST PROGRESS NOTE   Session Date: 09/19/2023  Session Time: 1311 - 1415 Virtual Visit via Video Note  I connected with Ruth Escobar on 09/19/23 at  1:11 PM EST by a video enabled telemedicine application and verified that I am speaking with the correct person using two identifiers.  Location: Patient: Home Provider: Home Office   I discussed the limitations of evaluation and management by telemedicine and the availability of in person appointments. The patient expressed understanding and agreed to proceed.    The patient was advised to call back or seek an in-person evaluation if the symptoms worsen or if the condition fails to improve as anticipated.  I provided 63 minutes of non-face-to-face time during this encounter.   Participation Level: Active  Behavioral Response: Casual and Well GroomedAlertEuthymic  Type of Therapy: Individual Therapy  Treatment Goals addressed:  - LTG: Reduce frequency, intensity, and duration of depression symptoms so that daily functioning is improved (OP Depression) - LTG: Increase coping skills to manage depression and improve ability to perform daily activities (OP Depression) - STG: Ruth "[name]" will reduce frequency of avoidant behaviors by 50% as evidenced by self-report in therapy sessions (OP Depression) - LTG: "I just want to understand my emotions and triggers for depression better" (OP Depression)  ProgressTowards Goals: Progressing  Interventions: CBT, Motivational Interviewing, and Supportive  Summary: "Ruth Escobar" is a 22 y.o. Trans female with past psych history of Adjustment D/O w/ mixed Anxiety and Depressed mood, presenting for follow-up therapy session in efforts to improve management of depressive and anxious symptoms. Pt maintained pleasant moods throughout session, engaging in reflection of past events and stressors since prior session. Pt detailed having maintained improved moods throughout recent weeks, revisiting  prior stressors relating to interactions with friends and accepting that pt is unable to change the feelings and perspectives of others. Explored pt's future plans for transitions in workplace and beginning academic program in a new city, processing pt's thoughts and feelings surrounding relocating and the factors leading pt to pursue new opportunities. Engaged in exploration of PHQ9 and GAD7 scalings, identifying continued downward trend in frequency and severity of sxs. Patient responded well to interventions. Patient continues to meet criteria for Adjustment D/O w/ mixed Anxiety and Depressed mood. Patient will continue to benefit from engagement in outpatient therapy due to being the least restrictive service to meet presenting needs.      09/19/2023    1:17 PM 08/30/2023    4:13 PM 08/16/2023    1:12 PM  GAD 7 : Generalized Anxiety Score  Nervous, Anxious, on Edge 0 0 1  Control/stop worrying 0 0 0  Worry too much - different things 1 0 1  Trouble relaxing 0 1 1  Restless 0 0 0  Easily annoyed or irritable 1 1 2   Afraid - awful might happen 0 0 0  Total GAD 7 Score 2 2 5   Anxiety Difficulty  Somewhat difficult Somewhat difficult      09/19/2023    1:19 PM 08/30/2023    4:17 PM 08/16/2023    1:07 PM 08/01/2023    4:32 PM  Depression screen PHQ 2/9  Decreased Interest 0 1 1 1   Down, Depressed, Hopeless 0 0 1 2  PHQ - 2 Score 0 1 2 3   Altered sleeping 1 0 1 2  Tired, decreased energy 1 0 1 3  Change in appetite 0 0 0 3  Feeling bad or failure about yourself  1 1 0 3  Trouble  concentrating 0 1 1 1   Moving slowly or fidgety/restless 0 0 0 1  Suicidal thoughts 0 0 0 1  PHQ-9 Score 3 3 5 17   Difficult doing work/chores Not difficult at all Somewhat difficult Not difficult at all Very difficult   Suicidal/Homicidal: No  Therapist Response: Clinician utilized CBT, MI, and supportive reflections to address pt's identified stressors. Engaged pt in reflection of recent events,  processing improvements in moods, reduction in stressors, and action steps towards long term goals. Evoked pt's recounts of interactions among peer group, and explored thoughts and feelings related to relationships. Explored thoughts, feelings, and perspectives related to pursuing new opportunities, and elicited pt's thoughts surrounding various motivating factors. Readministered PHQ9 and GAD7, engaging pt in processing trends in scaling. Therapist provided support and empathy to patient during session.  Plan: Return again in 2 weeks.  Diagnosis:  Encounter Diagnosis  Name Primary?   Adjustment disorder with mixed anxiety and depressed mood Yes    Collaboration of Care: Other none necessary at this time.  Patient/Guardian was advised Release of Information must be obtained prior to any record release in order to collaborate their care with an outside provider. Patient/Guardian was advised if they have not already done so to contact the registration department to sign all necessary forms in order for [place] to release information regarding their care.   Consent: Patient/Guardian gives verbal consent for treatment and assignment of benefits for services provided during this visit. Patient/Guardian expressed understanding and agreed to proceed.   [name], MSW, LCSW 09/19/2023

## 2023-09-28 ENCOUNTER — Other Ambulatory Visit (HOSPITAL_COMMUNITY): Payer: Self-pay

## 2023-10-03 ENCOUNTER — Ambulatory Visit (INDEPENDENT_AMBULATORY_CARE_PROVIDER_SITE_OTHER): Payer: 59 | Admitting: Licensed Clinical Social Worker

## 2023-10-03 DIAGNOSIS — F4323 Adjustment disorder with mixed anxiety and depressed mood: Secondary | ICD-10-CM | POA: Diagnosis not present

## 2023-10-03 NOTE — Progress Notes (Signed)
 THERAPIST PROGRESS NOTE   Session Date: 10/03/2023  Session Time: 1510 - 1556  Participation Level: Active  Behavioral Response: Casual and Well GroomedLethargicEuthymic  Type of Therapy: Individual Therapy  Treatment Goals addressed:  - LTG: Reduce frequency, intensity, and duration of depression symptoms so that daily functioning is improved (OP Depression) - LTG: Increase coping skills to manage depression and improve ability to perform daily activities (OP Depression) - STG: Ruth Escobar will reduce frequency of avoidant behaviors by 50% as evidenced by self-report in therapy sessions (OP Depression) - LTG: I just want to understand my emotions and triggers for depression better (OP Depression)  ProgressTowards Goals: Progressing  Interventions: CBT, Motivational Interviewing, and Supportive  Summary: Ruth Escobar is a 23 y.o. Trans female with past psych history of Adjustment D/O w/ mixed Anxiety and Depressed mood, presenting for follow-up therapy session in efforts to improve management of depressive and anxious symptoms. Pt actively engaged in scheduled session, maintaining pleasant moods throughout session, providing detailed recounts of events over the last 2 weeks, sharing of overall minimal stress, and experiencing overall satisfaction, and excitement, in relation to decision to pursue alternate education, employment, and residence in [place] later this year.  Patient detailed of having spent time recently exploring own interest in potentially pursuing romantic interests, and internal efforts at processing readiness of establishing emotional intimate relationships versus sexual relationships.  Actively explored history of most recent romantic relationship and challenges leading to separation, as well as pt's identified means of filling a void by engaging in purely sexual relationship with a friend from mutual friend group. Encouraged pt to further explore thoughts and perspectives  related to understanding of intimacy over coming weeks to aid in future discussions surrounding components deemed to be most important within relationship to pt.  Patient responded well to interventions. Patient continues to meet criteria for Adjustment D/O w/ mixed Anxiety and Depressed mood. Patient will continue to benefit from engagement in outpatient therapy due to being the least restrictive service to meet presenting needs.      10/03/2023    3:16 PM 09/19/2023    1:17 PM 08/30/2023    4:13 PM 08/16/2023    1:12 PM  GAD 7 : Generalized Anxiety Score  Nervous, Anxious, on Edge 1 0 0 1  Control/stop worrying 1 0 0 0  Worry too much - different things 0 1 0 1  Trouble relaxing 0 0 1 1  Restless 0 0 0 0  Easily annoyed or irritable 1 1 1 2   Afraid - awful might happen 0 0 0 0  Total GAD 7 Score 3 2 2 5   Anxiety Difficulty Not difficult at all  Somewhat difficult Somewhat difficult      10/03/2023    3:14 PM 09/19/2023    1:19 PM 08/30/2023    4:17 PM 08/16/2023    1:07 PM 08/01/2023    4:32 PM  Depression screen PHQ 2/9  Decreased Interest 1 0 1 1 1   Down, Depressed, Hopeless 0 0 0 1 2  PHQ - 2 Score 1 0 1 2 3   Altered sleeping 0 1 0 1 2  Tired, decreased energy 0 1 0 1 3  Change in appetite 0 0 0 0 3  Feeling bad or failure about yourself  1 1 1  0 3  Trouble concentrating 0 0 1 1 1   Moving slowly or fidgety/restless 0 0 0 0 1  Suicidal thoughts 0 0 0 0 1  PHQ-9 Score 2 3 3  5  17  Difficult doing work/chores Not difficult at all Not difficult at all Somewhat difficult Not difficult at all Very difficult   Suicidal/Homicidal: No  Therapist Response: Clinician utilized CBT, MI, and supportive reflections to address pt's identified stressors. Engaged pt in reflection of recent events, exploring pt's thoughts, feelings, and perspectives surrounding continued improvements in moods. Actively listened and supported pt's recounts of recent events of the past two weeks, eliciting  pt's perspectives surrounding expressed interests regarding relationship and potential exploration of emotional romantic connections. Assigned pt task to reflect on perspective and understanding of intimacy, and overall meaning of intimacy for pt in regards to romantic relationships.  Clinician reassessed severity of depressive and anxious sxs, and presence of any safety concerns. Therapist provided support and empathy to patient during session.   Plan: Return again in 2 weeks.  Diagnosis:  Encounter Diagnosis  Name Primary?   Adjustment disorder with mixed anxiety and depressed mood Yes     Collaboration of Care: Other none necessary at this time.  Patient/Guardian was advised Release of Information must be obtained prior to any record release in order to collaborate their care with an outside provider. Patient/Guardian was advised if they have not already done so to contact the registration department to sign all necessary forms in order for us  to release information regarding their care.   Consent: Patient/Guardian gives verbal consent for treatment and assignment of benefits for services provided during this visit. Patient/Guardian expressed understanding and agreed to proceed.   Lynwood JONETTA Maris, MSW, LCSW 10/03/23

## 2023-10-17 ENCOUNTER — Ambulatory Visit (INDEPENDENT_AMBULATORY_CARE_PROVIDER_SITE_OTHER): Payer: 59 | Admitting: Licensed Clinical Social Worker

## 2023-10-17 DIAGNOSIS — F4323 Adjustment disorder with mixed anxiety and depressed mood: Secondary | ICD-10-CM | POA: Diagnosis not present

## 2023-10-17 NOTE — Progress Notes (Unsigned)
THERAPIST PROGRESS NOTE   Session Date: 10/17/2023  Session Time: 1510 - 1556  Participation Level: Active  Behavioral Response: CasualAlertEuthymic  Type of Therapy: Individual Therapy  Treatment Goals addressed:  - LTG: Reduce frequency, intensity, and duration of depression symptoms so that daily functioning is improved (OP Depression) - LTG: Increase coping skills to manage depression and improve ability to perform daily activities (OP Depression) - STG: Ruth "[name]" will reduce frequency of avoidant behaviors by 50% as evidenced by self-report in therapy sessions (OP Depression) - LTG: "I just want to understand my emotions and triggers for depression better" (OP Depression)  ProgressTowards Goals: Progressing  Interventions: CBT, Motivational Interviewing, and Supportive  Summary: "Ruth Escobar" is a 23 y.o. Trans female with past psych history of Adjustment D/O w/ mixed Anxiety and Depressed mood, presenting for follow-up therapy session in efforts to improve management of depressive and anxious symptoms. Pt actively engaged in scheduled session, maintaining pleasant moods and congruent affect throughout.  , providing detailed recounts of events over the last 2 weeks, sharing of overall minimal stress, and experiencing overall satisfaction, and excitement, in relation to decision to pursue alternate education, employment, and residence in [place] later this year.  Patient detailed of having spent time recently exploring own interest in potentially pursuing romantic interests, and internal efforts at processing readiness of establishing emotional intimate relationships versus sexual relationships.  Actively explored history of most recent romantic relationship and challenges leading to separation, as well as pt's identified means of filling a void by engaging in purely sexual relationship with a friend from mutual friend group. Encouraged pt to further explore thoughts and perspectives  related to understanding of intimacy over coming weeks to aid in future discussions surrounding components deemed to be most important within relationship to pt.  Patient responded well to interventions. Patient continues to meet criteria for Adjustment D/O w/ mixed Anxiety and Depressed mood. Patient will continue to benefit from engagement in outpatient therapy due to being the least restrictive service to meet presenting needs.      10/17/2023    3:12 PM 10/03/2023    3:16 PM 09/19/2023    1:17 PM 08/30/2023    4:13 PM  GAD 7 : Generalized Anxiety Score  Nervous, Anxious, on Edge 1 1 0 0  Control/stop worrying 0 1 0 0  Worry too much - different things 0 0 1 0  Trouble relaxing 0 0 0 1  Restless 0 0 0 0  Easily annoyed or irritable 0 1 1 1   Afraid - awful might happen 0 0 0 0  Total GAD 7 Score 1 3 2 2   Anxiety Difficulty Not difficult at all Not difficult at all  Somewhat difficult      10/17/2023    3:13 PM 10/03/2023    3:14 PM 09/19/2023    1:19 PM 08/30/2023    4:17 PM 08/16/2023    1:07 PM  Depression screen PHQ 2/9  Decreased Interest 0 1 0 1 1  Down, Depressed, Hopeless 0 0 0 0 1  PHQ - 2 Score 0 1 0 1 2  Altered sleeping 0 0 1 0 1  Tired, decreased energy 0 0 1 0 1  Change in appetite 1 0 0 0 0  Feeling bad or failure about yourself  0 1 1 1  0  Trouble concentrating 0 0 0 1 1  Moving slowly or fidgety/restless 0 0 0 0 0  Suicidal thoughts 0 0 0 0 0  PHQ-9 Score 1  2 3 3 5   Difficult doing work/chores Not difficult at all Not difficult at all Not difficult at all Somewhat difficult Not difficult at all   Suicidal/Homicidal: No  Therapist Response: Clinician utilized CBT, MI, and supportive reflections to address pt's identified stressors.   Engaged pt in reflection of recent events, exploring pt's thoughts, feelings, and perspectives surrounding continued improvements in moods. Actively listened and supported pt's recounts of recent events of the past two weeks,  eliciting pt's perspectives surrounding expressed interests regarding relationship and potential exploration of emotional romantic connections. Assigned pt task to reflect on perspective and understanding of intimacy, and overall meaning of intimacy for pt in regards to romantic relationships.  Clinician reassessed severity of depressive and anxious sxs, and presence of any safety concerns. Therapist provided support and empathy to patient during session.   Plan: Return again in 2 weeks.  Diagnosis:  Encounter Diagnosis  Name Primary?   Adjustment disorder with mixed anxiety and depressed mood Yes      Collaboration of Care: Other none necessary at this time.  Patient/Guardian was advised Release of Information must be obtained prior to any record release in order to collaborate their care with an outside provider. Patient/Guardian was advised if they have not already done so to contact the registration department to sign all necessary forms in order for [place] to release information regarding their care.   Consent: Patient/Guardian gives verbal consent for treatment and assignment of benefits for services provided during this visit. Patient/Guardian expressed understanding and agreed to proceed.   [name], MSW, LCSW 10/17/23

## 2023-10-19 DIAGNOSIS — S93431D Sprain of tibiofibular ligament of right ankle, subsequent encounter: Secondary | ICD-10-CM | POA: Diagnosis not present

## 2023-10-31 ENCOUNTER — Ambulatory Visit (INDEPENDENT_AMBULATORY_CARE_PROVIDER_SITE_OTHER): Payer: 59 | Admitting: Licensed Clinical Social Worker

## 2023-10-31 DIAGNOSIS — F4323 Adjustment disorder with mixed anxiety and depressed mood: Secondary | ICD-10-CM | POA: Diagnosis not present

## 2023-10-31 NOTE — Progress Notes (Signed)
THERAPIST PROGRESS NOTE   Session Date: 10/31/2023  Session Time: 1515 - 1602  Participation Level: Active  Behavioral Response: CasualAlertEuthymic  Type of Therapy: Individual Therapy  Treatment Goals addressed:  - LTG: Reduce frequency, intensity, and duration of depression symptoms so that daily functioning is improved (OP Depression) - LTG: Increase coping skills to manage depression and improve ability to perform daily activities (OP Depression) - STG: Ruth "[name]" will reduce frequency of avoidant behaviors by 50% as evidenced by self-report in therapy sessions (OP Depression) - LTG: "I just want to understand my emotions and triggers for depression better" (OP Depression)  ProgressTowards Goals: Progressing  Interventions: CBT, Motivational Interviewing, and Supportive  Summary: "Ruth Escobar" is a 23 y.o. Trans female with past psych history of Adjustment D/O w/ mixed Anxiety and Depressed mood, presenting for follow-up therapy session in efforts to improve management of depressive and anxious symptoms. Pt actively engaged in scheduled session, presenting in overall pleasant moods, with mild anxiousness, maintaining pleasant moods and congruent affect throughout. Pt  Engaged in reassessing PHQ9 and GAD7, noting maintained none-minimal sxs range, collaboratively determining abilities to discontinue administering of screenings unless sxs and presentation warrant.  - Processed recent events, increasing anxiety and stress related to challenging workplace relationship, identifying thoughts and feelings related to interactions and perceptions of being targeted by individuals and retaliatory behaviors by individual being related to pt's implemented boundaries approx. 3 months ago.  - Processed understanding of cognitive distortions and hx of catastrophizing interactions and outcomes, further processing validity of thoughts, and discerning between rational vs irrational thinking. Further  processed ways in which pt has observed instances of cognitive distortions impacting thoughts and perspectives.  Patient responded well to interventions. Patient continues to meet criteria for Adjustment D/O w/ mixed Anxiety and Depressed mood. Patient will continue to benefit from engagement in outpatient therapy due to being the least restrictive service to meet presenting needs.      10/31/2023    3:19 PM 10/17/2023    3:12 PM 10/03/2023    3:16 PM 09/19/2023    1:17 PM  GAD 7 : Generalized Anxiety Score  Nervous, Anxious, on Edge 0 1 1 0  Control/stop worrying 0 0 1 0  Worry too much - different things 0 0 0 1  Trouble relaxing 0 0 0 0  Restless 0 0 0 0  Easily annoyed or irritable 1 0 1 1  Afraid - awful might happen 0 0 0 0  Total GAD 7 Score 1 1 3 2   Anxiety Difficulty Not difficult at all Not difficult at all Not difficult at all       10/31/2023    3:23 PM 10/17/2023    3:13 PM 10/03/2023    3:14 PM 09/19/2023    1:19 PM 08/30/2023    4:17 PM  Depression screen PHQ 2/9  Decreased Interest 0 0 1 0 1  Down, Depressed, Hopeless 0 0 0 0 0  PHQ - 2 Score 0 0 1 0 1  Altered sleeping 1 0 0 1 0  Tired, decreased energy 0 0 0 1 0  Change in appetite 0 1 0 0 0  Feeling bad or failure about yourself  0 0 1 1 1   Trouble concentrating 0 0 0 0 1  Moving slowly or fidgety/restless 1 0 0 0 0  Suicidal thoughts 0 0 0 0 0  PHQ-9 Score 2 1 2 3 3   Difficult doing work/chores Not difficult at all Not difficult at all Not  difficult at all Not difficult at all Somewhat difficult   Suicidal/Homicidal: No  Therapist Response: Clinician utilized CBT, MI, and supportive reflections to address pt's identified stressors. Clinician engaged pt in check-in, assessing mood and affect. Reassessed PHQ9 and GAD7, engaging pt in reflection of maintained downward trends, and processing abilities to d/c administering of screenings in future sessions unless sxs and presentation determine otherwise.    Actively listened to pt's recounts of recent events, specifically noting stress surrounding workplace interactions and feeling targeted, evoking pt's thoughts and feelings related to events and responses. Elicited pt's understanding of cognitive distortions, processing how distortions prove to alter ones perspectives and interpretation of events and situations. Prompted pt to explore areas in which distorted cognitions impacted interactions, outcomes, and perspectives.  Clinician reassessed severity of depressive and anxious sxs, and presence of any safety concerns. Therapist provided support and empathy to patient during session.   Plan: Return again in 2 weeks.  Diagnosis:  Encounter Diagnosis  Name Primary?   Adjustment disorder with mixed anxiety and depressed mood Yes    Collaboration of Care: Other none necessary at this time.  Patient/Guardian was advised Release of Information must be obtained prior to any record release in order to collaborate their care with an outside provider. Patient/Guardian was advised if they have not already done so to contact the registration department to sign all necessary forms in order for [place] to release information regarding their care.   Consent: Patient/Guardian gives verbal consent for treatment and assignment of benefits for services provided during this visit. Patient/Guardian expressed understanding and agreed to proceed.   [name], MSW, LCSW 10/31/23

## 2023-11-08 ENCOUNTER — Ambulatory Visit (HOSPITAL_BASED_OUTPATIENT_CLINIC_OR_DEPARTMENT_OTHER): Payer: 59 | Admitting: Family

## 2023-11-08 ENCOUNTER — Other Ambulatory Visit (HOSPITAL_COMMUNITY): Payer: Self-pay

## 2023-11-08 ENCOUNTER — Encounter (HOSPITAL_COMMUNITY): Payer: Self-pay | Admitting: Family

## 2023-11-08 VITALS — BP 120/81 | HR 84 | Ht 63.0 in | Wt 219.0 lb

## 2023-11-08 DIAGNOSIS — F4323 Adjustment disorder with mixed anxiety and depressed mood: Secondary | ICD-10-CM | POA: Diagnosis not present

## 2023-11-08 MED ORDER — BUPROPION HCL ER (XL) 150 MG PO TB24
150.0000 mg | ORAL_TABLET | ORAL | 0 refills | Status: DC
  Filled 2023-11-08 – 2024-01-19 (×2): qty 90, 90d supply, fill #0

## 2023-11-08 MED ORDER — HYDROXYZINE HCL 10 MG PO TABS
10.0000 mg | ORAL_TABLET | Freq: Every evening | ORAL | 0 refills | Status: DC | PRN
  Filled 2023-11-08: qty 30, 15d supply, fill #0

## 2023-11-08 NOTE — Progress Notes (Addendum)
 [facility] MD/PA/NP OP Progress Note  11/08/2023 3:29 PM Ruth Escobar  MRN:  [id number]  Chief Complaint: Medication management  HPI: Ruth " Michah" presents for medication management follow-up appointment.  Currently, patient is prescribed Wellbutrin150 mg  and hydroxyzine 10mg  as needed.  Reports feeling " okay, almost like a heightened sense of emotions".   Patient stated they are unsure if this is there new baseline or is this is what normal feels like"   No concerns related to suicidal or homicidal ideations.  Denies auditory visual hallucinations.  Reports hydroxyzine 25 to 50 mg is too sedating.  Discussed making hydroxyzine 10 mg available for sleep disturbance.  Patient was receptive to plan.  Will continue Wellbutrin 150 mg daily.  Patient to continue individual therapy at this time.  Given patient's history which she reports/overnight has requested to start medication at 5 PM.  Reports they are unable to sleep on their off days.  Was amendable to trying medication in the evening.  Did discuss activating features with Wellbutrin.  Adjustment disorder mixed depression/ anxiety: Decreased hydroxyzine available 25 mg and may 10 mg available as needed 3 times daily for sleep and anxiety. Continue Wellbutrin 150 daily Follow-up 3 months for medication management Keep all outpatient follow-up appointments with individual therapy    Visit Diagnosis:    ICD-10-CM   1. Adjustment disorder with mixed anxiety and depressed mood  F43.23       Past Psychiatric History:   Past Medical History:  Past Medical History:  Diagnosis Date   Asthma    No past surgical history on file.  Family Psychiatric History:   Family History:  Family History  Problem Relation Age of Onset   Cancer Mother    Cancer Father    Hypertension Father     Social History:  Social History   Socioeconomic History   Marital status: Single    Spouse name: Not on file   Number of children: Not on file    Years of education: Not on file   Highest education level: Not on file  Occupational History   Not on file  Tobacco Use   Smoking status: Never   Smokeless tobacco: Never  Vaping Use   Vaping status: Never Used  Substance and Sexual Activity   Alcohol use: No   Drug use: No   Sexual activity: Not on file  Other Topics Concern   Not on file  Social History Narrative   Not on file   Social Drivers of Health   Financial Resource Strain: Not on file  Food Insecurity: Not on file  Transportation Needs: Not on file  Physical Activity: Not on file  Stress: Not on file  Social Connections: Not on file    Allergies:  Allergies  Allergen Reactions   Bee Pollen     Other reaction(s): Cough   Citrus Rash   Pollen Extract Cough   Shellfish Allergy Anaphylaxis   Iodine    Pimecrolimus Rash and Other (See Comments)    Other Reaction(s): Other (See Comments)  rash  Other reaction(s): Unknown  rash    Metabolic Disorder Labs: No results found for: "HGBA1C", "MPG" No results found for: "PROLACTIN" No results found for: "CHOL", "TRIG", "HDL", "CHOLHDL", "VLDL", "LDLCALC" No results found for: "TSH"  Therapeutic Level Labs: No results found for: "LITHIUM" No results found for: "VALPROATE" No results found for: "CBMZ"  Current Medications: Current Outpatient Medications  Medication Sig Dispense Refill   hydrOXYzine (ATARAX) 10 MG  tablet Take 1 tablet (10 mg total) by mouth at bedtime as needed and may repeat dose one time if needed. 30 tablet 0   albuterol (PROAIR HFA) 108 (90 Base) MCG/ACT inhaler Inhale 2 puffs into the lungs every 6 (six) hours as needed. 18 g 0   albuterol-ipratropium (COMBIVENT) 18-103 MCG/ACT inhaler Inhale 2 puffs into the lungs every 6 (six) hours as needed.     beclomethasone (QVAR) 80 MCG/ACT inhaler Inhale 2 puffs into the lungs 2 (two) times daily. (Patient not taking: Reported on 08/01/2023) 1 Inhaler 3   buPROPion (WELLBUTRIN XL) 150 MG 24 hr  tablet Take 1 tablet (150 mg total) by mouth every morning. 90 tablet 0   cetirizine (ZYRTEC) 10 MG tablet Take 10 mg by mouth daily.     fluticasone (FLONASE) 50 MCG/ACT nasal spray Place 2 sprays into both nostrils daily.     montelukast (SINGULAIR) 10 MG tablet Take 1 tablet (10 mg total) by mouth daily. 90 tablet 3   Norethindrone Acetate-Ethinyl Estrad-FE (BLISOVI 24 FE) 1-20 MG-MCG(24) tablet Blisovi 24 Fe 1 mg-20 mcg (24)/75 mg (4) tablet  TAKE 1 TABLET BY MOUTH EVERY DAY     No current facility-administered medications for this visit.     Musculoskeletal: Strength & Muscle Tone: within normal limits Gait & Station: normal Patient leans: N/A  Psychiatric Specialty Exam: Review of Systems  Constitutional: Negative.   Psychiatric/Behavioral:  Positive for sleep disturbance. Negative for decreased concentration. The patient is not nervous/anxious.   All other systems reviewed and are negative.   Blood pressure 120/81, pulse 84, height 5\' 3"  (1.6 m), weight 219 lb (99.3 kg).Body mass index is 38.79 kg/m.  General Appearance: Casual  Eye Contact:  Good  Speech:  Clear and Coherent  Volume:  Normal  Mood:  Anxious and Depressed  Affect:  Congruent  Thought Process:  Coherent  Orientation:  Full (Time, Place, and Person)  Thought Content: Logical   Suicidal Thoughts:  No  Homicidal Thoughts:  No  Memory:  Immediate;   Good Recent;   Good  Judgement:  Good  Insight:  Good  Psychomotor Activity:  Normal  Concentration:  Concentration: Good  Recall:  Good  Fund of Knowledge: Good  Language: Fair  Akathisia:  No  Handed:  Right  AIMS (if indicated): done  Assets:  Communication Skills Desire for Improvement  ADL's:  Intact  Cognition: WNL  Sleep:  Good   Screenings: GAD-7    Advertising copywriter from 10/31/2023 in [facility] [place] Counselor from 10/17/2023 in [facility] [place] Counselor  from 10/03/2023 in [facility] [place] Counselor from 09/19/2023 in [facility] [place] Counselor from 08/30/2023 in [facility]  Total GAD-7 Score 1 1 3 2 2       PHQ2-9    Flowsheet Row Counselor from 10/31/2023 in [facility] [place] Counselor from 10/17/2023 in [facility] [place] Counselor from 10/03/2023 in [facility] at [facility] from 09/19/2023 in [facility] at [place] Counselor from 08/30/2023 in [facility] at [facility] Total Score 0 0 1 0 1  PHQ-9 Total Score 2 1 2 3 3       Flowsheet Row Counselor from 08/16/2023 in [facility] at [facility] ED from 08/10/2023 in [facility]  Emergency Department at [facility] Visit from [facility]01/2023 [facility]  C-SSRS RISK CATEGORY High Risk No Risk Error: Q7 should not be populated when Q6 is No        Assessment and Plan: Ruth Escobar 23 year old African-American transgender presents for medication management.  Patient was initiated on Wellbutrin and hydroxyzine for mood stabilization.  Reports they have been taking medications as directed.  Denying any medication side effects.  Reports overall mood has stabilized.  However continues to have highs and lows.  Will continue to monitor symptoms.  Discussed titrating hydroxyzine downward due to oversedation.  Patient to follow-up 3 months for medication management follow-up appointment  Adjustment disorder mixed depression/ anxiety: Decreased hydroxyzine available 25 mg and may 10 mg available as needed 3 times daily for sleep and anxiety. Continue Wellbutrin 150 daily Follow-up 3 months for medication management Keep  all outpatient follow-up appointments with individual therapy   Collaboration of Care: Collaboration of Care: Medication Management AEB continue Wellbutrin 150 mg and Other Cyril Loosen therapist  Patient/Guardian was advised Release of Information must be obtained prior to any record release in order to collaborate their care with an outside provider. Patient/Guardian was advised if they have not already done so to contact the registration department to sign all necessary forms in order for [place] to release information regarding their care.   Consent: Patient/Guardian gives verbal consent for treatment and assignment of benefits for services provided during this visit. Patient/Guardian expressed understanding and agreed to proceed.    Oneta Rack, NP 11/08/2023, 3:29 PM

## 2023-11-14 DIAGNOSIS — R41840 Attention and concentration deficit: Secondary | ICD-10-CM | POA: Diagnosis not present

## 2023-11-14 DIAGNOSIS — F3341 Major depressive disorder, recurrent, in partial remission: Secondary | ICD-10-CM | POA: Diagnosis not present

## 2023-11-14 DIAGNOSIS — F410 Panic disorder [episodic paroxysmal anxiety] without agoraphobia: Secondary | ICD-10-CM | POA: Diagnosis not present

## 2023-11-16 ENCOUNTER — Ambulatory Visit (INDEPENDENT_AMBULATORY_CARE_PROVIDER_SITE_OTHER): Payer: 59 | Admitting: Licensed Clinical Social Worker

## 2023-11-16 DIAGNOSIS — F4323 Adjustment disorder with mixed anxiety and depressed mood: Secondary | ICD-10-CM

## 2023-11-16 NOTE — Progress Notes (Signed)
 THERAPIST PROGRESS NOTE   Session Date: 11/16/2023  Session Time: 8469 - 1705  Participation Level: Active  Behavioral Response: CasualAlertEuthymic  Type of Therapy: Individual Therapy  Treatment Goals addressed:  - LTG: Reduce frequency, intensity, and duration of depression symptoms so that daily functioning is improved (OP Depression) - LTG: Increase coping skills to manage depression and improve ability to perform daily activities (OP Depression) (MET)  - STG: Leola "[name]" will reduce frequency of avoidant behaviors by 50% as evidenced by self-report in therapy sessions (OP Depression) - LTG: "I just want to understand my emotions and triggers for depression better" (OP Depression)  ProgressTowards Goals: Progressing  Interventions: CBT, Motivational Interviewing, and Supportive  Summary: "[name]" is a 23 y.o. Trans female with past psych history of Adjustment D/O w/ mixed Anxiety and Depressed mood, presenting for follow-up therapy session in efforts to improve management of depressive and anxious symptoms. Pt actively engaged in scheduled session, presenting in overall pleasant moods, maintaining pleasant moods and congruent affect throughout.   - Openly engaged in check-in, sharing of things going well overall over the past 2 weeks since previous session.  -Actively engaged in brief review of prior PHQ-9 and GAD-7 screenings, reaffirming having discontinued need for administering screenings due to consistent minimal reported symptoms.  -Actively engaged in 3 months review review of individualized treatment goals outlined in treatment plan, determining reduction of frequency, intensity, and duration of depressive symptoms, avoidant behaviors, and understanding of emotions and triggers for depression to all be improving, as well as goal related to increasing coping skills to manage depression having been met/completed.  Patient detailed of continuing to experience the occasional day  when they may feel down, but overall improved.  Additionally, patient detailed of having increased efforts at managing avoidant behaviors as previous efforts at therapy with alternate provider had been conducted virtually from patient's bed, as well as patient reported history of avoiding other tasks such as cleaning apartment having improved. -Patient reported having recently experienced what she believed to be mild panic-like symptoms, engaging further in processing presenting stressors of that previous day, stress surrounding work, and anxiety provoking interactions with colleagues, and potential for symptoms to be related to that of decompressing. -Patient presented additional stressor at close of session surrounding challenging friend relationship and patient's awareness of details surrounding factors related to romantic relationship of friend that patient feels would be important to share with friend however friendship is currently estranged.  Briefly processed methods in which patient could navigate barriers to avoid harm.  Patient responded well to interventions. Patient continues to meet criteria for Adjustment D/O w/ mixed Anxiety and Depressed mood. Patient will continue to benefit from engagement in outpatient therapy due to being the least restrictive service to meet presenting needs.      10/31/2023    3:19 PM 10/17/2023    3:12 PM 10/03/2023    3:16 PM 09/19/2023    1:17 PM  GAD 7 : Generalized Anxiety Score  Nervous, Anxious, on Edge 0 1 1 0  Control/stop worrying 0 0 1 0  Worry too much - different things 0 0 0 1  Trouble relaxing 0 0 0 0  Restless 0 0 0 0  Easily annoyed or irritable 1 0 1 1  Afraid - awful might happen 0 0 0 0  Total GAD 7 Score 1 1 3 2   Anxiety Difficulty Not difficult at all Not difficult at all Not difficult at all       10/31/2023  3:23 PM 10/17/2023    3:13 PM 10/03/2023    3:14 PM 09/19/2023    1:19 PM 08/30/2023    4:17 PM  Depression screen PHQ  2/9  Decreased Interest 0 0 1 0 1  Down, Depressed, Hopeless 0 0 0 0 0  PHQ - 2 Score 0 0 1 0 1  Altered sleeping 1 0 0 1 0  Tired, decreased energy 0 0 0 1 0  Change in appetite 0 1 0 0 0  Feeling bad or failure about yourself  0 0 1 1 1   Trouble concentrating 0 0 0 0 1  Moving slowly or fidgety/restless 1 0 0 0 0  Suicidal thoughts 0 0 0 0 0  PHQ-9 Score 2 1 2 3 3   Difficult doing work/chores Not difficult at all Not difficult at all Not difficult at all Not difficult at all Somewhat difficult   Suicidal/Homicidal: No  Therapist Response: Clinician utilized CBT, MI, and supportive reflections to address pt's identified stressors. Clinician engaged pt in check-in, assessing mood and affect.    -Reviewed prior determination of abilities to discontinue PHQ-9 and GAD-7 screenings based on 3 months of consistent minimal reported symptoms.  -Actively engage patient in 23-month review of individualized treatment goals identified within treatment plan, utilizing Socratic questioning to further evoke patient's thoughts and perspectives surrounding progress to date. - Actively listened to patient's recounts of recent events, providing support to the patient and reflection of the events, further eliciting details in relation to stressors, challenging patient to explore thoughts and perspectives surrounding events.  Further supported patient in navigating reported symptoms, and identifying varying contributing stressors, and processing how these may prove to impact patient. -Further supported patient in navigating challenges regarding whether or not to approach friend regarding concerning information in relation to friend's relationship.  Clinician reassessed severity of depressive and anxious sxs, and presence of any safety concerns. Therapist provided support and empathy to patient during session.   Plan: Return again in 2 weeks.  Diagnosis:  Encounter Diagnosis  Name Primary?   Adjustment  disorder with mixed anxiety and depressed mood Yes    Collaboration of Care: Other none necessary at this time.  Patient/Guardian was advised Release of Information must be obtained prior to any record release in order to collaborate their care with an outside provider. Patient/Guardian was advised if they have not already done so to contact the registration department to sign all necessary forms in order for [place] to release information regarding their care.   Consent: Patient/Guardian gives verbal consent for treatment and assignment of benefits for services provided during this visit. Patient/Guardian expressed understanding and agreed to proceed.   [name], MSW, LCSW 11/16/23

## 2023-11-18 DIAGNOSIS — F3341 Major depressive disorder, recurrent, in partial remission: Secondary | ICD-10-CM | POA: Diagnosis not present

## 2023-11-18 DIAGNOSIS — F410 Panic disorder [episodic paroxysmal anxiety] without agoraphobia: Secondary | ICD-10-CM | POA: Diagnosis not present

## 2023-11-30 ENCOUNTER — Encounter (HOSPITAL_COMMUNITY): Payer: Self-pay

## 2023-11-30 ENCOUNTER — Ambulatory Visit (HOSPITAL_COMMUNITY): Payer: 59 | Admitting: Licensed Clinical Social Worker

## 2023-11-30 DIAGNOSIS — F4323 Adjustment disorder with mixed anxiety and depressed mood: Secondary | ICD-10-CM | POA: Diagnosis not present

## 2023-11-30 NOTE — Progress Notes (Signed)
 THERAPIST PROGRESS NOTE   Session Date: 11/30/2023  Session Time: 1409 - 1503 Virtual Visit via Video Note  I connected with Ruth Escobar on 11/30/23 at  2:09 PM EDT by a video enabled telemedicine application and verified that I am speaking with the correct person using two identifiers.  Location: Patient: Home Provider: [facility] [place] [facility]   I discussed the limitations of evaluation and management by telemedicine and the availability of in person appointments. The patient expressed understanding and agreed to proceed.   I discussed the assessment and treatment plan with the patient. The patient was provided an opportunity to ask questions and all were answered. The patient agreed with the plan and demonstrated an understanding of the instructions.   The patient was advised to call back or seek an in-person evaluation if the symptoms worsen or if the condition fails to improve as anticipated.  I provided 52 minutes of non-face-to-face time during this encounter.  Participation Level: Active  Behavioral Response: CasualAlertEuthymic  Type of Therapy: Individual Therapy  Treatment Goals addressed:  - LTG: Reduce frequency, intensity, and duration of depression symptoms so that daily functioning is improved (OP Depression) - STG: Ruth "[name]" will reduce frequency of avoidant behaviors by 50% as evidenced by self-report in therapy sessions (OP Depression) - LTG: "I just want to understand my emotions and triggers for depression better" (OP Depression)  ProgressTowards Goals: Progressing  Interventions: CBT, Motivational Interviewing, and Supportive  Summary: "Ruth Escobar" is a 23 y.o. Trans female with past psych history of Adjustment D/O w/ mixed Anxiety and Depressed mood, presenting for follow-up therapy session in efforts to improve management of presenting stressors. Pt actively engaged in scheduled session, presenting in overall pleasant moods, maintaining pleasant moods and  congruent affect throughout.   -Patient openly engaged in introductory check-in, expressing appreciation for abilities to change appointment to virtual means due to current illness related to gluten sensitivity triggered by dinner with parents yesterday evening.  Patient actively engaged in discussion surrounding prior determination of abilities to discontinue administering of PHQ-9 and GAD-7 based on trends across recent months of minimal experienced symptoms.  Patient further detailed recent events, sharing of "work his work, and I feel that I am finally getting my finances together ".  Patient further elaborated on recent improved financial stability, sharing of having gotten caught up on bills, establishing a little financial cushion, sharing of feeling good to be financially stable.  Patient further shared of continued work towards identified treatment goals of reducing isolative behaviors, detailing of having worked more overtime over recent weeks, as well as outside of work having spent more time with their dog and meeting new people in their neighborhood whom they have been spending more time with, as well as being more intentional towards friendships outside of work on days off. Further explored ways in which pt has been able to begin navigating thoughts and feelings, coping with stressors, identifying journaling as primary means of managing things, sharing of video games working but only temporarily. Explored journaling further and noting various different progressions in management of anxious thoughts and moods, sharing of journal most when stressed out or worrying too much about things, noting of when journaling pt's thoughts aren't very cohesive but more so serving mostly as a brain dump. Pt further elaborated on having noticed when journaling before going to bed definitely helps sleep better, "Because brain's not on overdrive", helping him wind down better, as well as proving able to think clearer,  and then  plan better and prioritize other factors of life. - Briefly shared of having recent thoughts and feelings over past two weeks of not wanting to be in [place] Anymore, but still in 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  Patient responded well to interventions. Patient continues to meet criteria for Adjustment D/O w/ mixed Anxiety and Depressed mood. Patient will continue to benefit from engagement in outpatient therapy due to being the least restrictive service to meet presenting needs.    Suicidal/Homicidal: No; No plan, no intent.  Therapist Response: Clinician utilized CBT, MI, and supportive reflections to address pt's identified stressors. Clinician engaged pt in check-in, assessing mood and affect.    - Actively engaged pt in introductory check-in, assessing presenting moods and affect, eliciting pt's perspectives of current presenting emotions. - Actively listened to pt's recounts of recent events of past two weeks, presenting stressors, and moods. - Evoked pt's thoughts and perspectives surrounding progressions related to tx goals, eliciting pt's thoughts and perspectives, utilizing socratic questioning in efforts to explore pt's thoughts further surrounding progressions and changes in behaviors. - Further supported pt in processing thoughts and feelings expressed surrounding identified benefits of journaling and support for pt in managing stressors and presenting challenges. - Reviewed ESA letter and necessary documentation to be provided to pt.  Clinician reassessed severity of depressive and anxious sxs, and presence of any safety concerns. Therapist provided support and empathy to patient during session.   Plan: Return again in 2 weeks.  Diagnosis:  Encounter Diagnosis  Name Primary?   Adjustment  disorder with mixed anxiety and depressed mood Yes     Collaboration of Care: Other none necessary at this time.  Patient/Guardian was advised Release of Information must be obtained prior to any record release in order to collaborate their care with an outside provider. Patient/Guardian was advised if they have not already done so to contact the registration department to sign all necessary forms in order for [place] to release information regarding their care.   Consent: Patient/Guardian gives verbal consent for treatment and assignment of benefits for services provided during this visit. Patient/Guardian expressed understanding and agreed to proceed.   [name], MSW, LCSW 11/30/23

## 2023-12-14 ENCOUNTER — Ambulatory Visit (INDEPENDENT_AMBULATORY_CARE_PROVIDER_SITE_OTHER): Payer: 59 | Admitting: Licensed Clinical Social Worker

## 2023-12-14 DIAGNOSIS — F4323 Adjustment disorder with mixed anxiety and depressed mood: Secondary | ICD-10-CM | POA: Diagnosis not present

## 2023-12-14 NOTE — Progress Notes (Unsigned)
 THERAPIST PROGRESS NOTE   Session Date: 12/14/2023  Session Time: 1409 - 1455  Participation Level: Active  Behavioral Response: CasualAlertEuthymic  Type of Therapy: Individual Therapy  Treatment Goals addressed:  - LTG: Reduce frequency, intensity, and duration of depression symptoms so that daily functioning is improved (OP Depression) - STG: Ruth "[name]" will reduce frequency of avoidant behaviors by 50% as evidenced by self-report in therapy sessions (OP Depression) - LTG: "I just want to understand my emotions and triggers for depression better" (OP Depression)  ProgressTowards Goals: Progressing  Interventions: CBT, Motivational Interviewing, and Supportive  Summary: "Ruth Escobar" is a 23 y.o. Trans female with past psych history of Adjustment D/O w/ mixed Anxiety and Depressed mood, presenting for follow-up therapy session in efforts to improve management of presenting stressors.  Pt actively engaged in scheduled session, presenting in overall pleasant moods, maintaining pleasant moods and congruent affect throughout. Pt openly detailed recent events, sharing of the past two weeks going well, experiencing minimal stressors, other than ongoing workplace stress surrounding drama among colleagues. Pt detailed individual efforts to avoid challenges and workplace conflict/drama, noting of factors having escalated to management and awaiting leadership resolution. Pt detailed stressors leading to their current perspectives surrounding the workplace to be negative, and further impacting readiness to leave current work environment. Further processed potential alternate options surrounding employment and/or locations, processing barriers to alternate workplace environments. Pt further reflected on, and discussed continued plans for police academy, processing expected timeline for completion and thoughts surrounding upcoming fitness test. Reported absence of depressive or anxious symptoms over the  past 2 weeks, outside of minor irritation surrounding workplace stressors.  Briefly processed dietary factors and expressed interest in continued weight loss journey, exploring current efforts, recent efforts, and recent progressions and weight loss.  Patient responded well to interventions. Patient continues to meet criteria for Adjustment D/O w/ mixed Anxiety and Depressed mood. Patient will continue to benefit from engagement in outpatient therapy due to being the least restrictive service to meet presenting needs.   Suicidal/Homicidal: No; No plan, no intent.  Therapist Response: Clinician utilized CBT, MI, and supportive reflections to address pt's identified stressors.   - Clinician actively engaged pt in introductory check-in, assessing presenting mood and affect, further prompting update of recent events, actively listening to recounts of events of the past two weeks, utilizing open-ended questions to support pt in reflection of events and presenting stressors. Utilized MI to support pt in processing readiness to manage and/or resolve presenting workplace stressors and explored various means of doing so.  Clinician reassessed severity of depressive and anxious sxs, and presence of any safety concerns. Therapist provided support and empathy to patient during session.   Plan: Return again in 2 weeks.  Diagnosis:  Encounter Diagnosis  Name Primary?   Adjustment disorder with mixed anxiety and depressed mood Yes    Collaboration of Care: Other none necessary at this time.  Patient/Guardian was advised Release of Information must be obtained prior to any record release in order to collaborate their care with an outside provider. Patient/Guardian was advised if they have not already done so to contact the registration department to sign all necessary forms in order for [place] to release information regarding their care.   Consent: Patient/Guardian gives verbal consent for treatment and  assignment of benefits for services provided during this visit. Patient/Guardian expressed understanding and agreed to proceed.   [name], MSW, LCSW 12/14/23

## 2023-12-28 ENCOUNTER — Ambulatory Visit (INDEPENDENT_AMBULATORY_CARE_PROVIDER_SITE_OTHER): Admitting: Licensed Clinical Social Worker

## 2023-12-28 DIAGNOSIS — F4323 Adjustment disorder with mixed anxiety and depressed mood: Secondary | ICD-10-CM

## 2023-12-28 NOTE — Progress Notes (Unsigned)
 THERAPIST PROGRESS NOTE   Session Date: 12/28/2023  Session Time: 1309 - 1402  Participation Level: Active  Behavioral Response: CasualAlertEuthymic  Type of Therapy: Individual Therapy  Treatment Goals addressed:  - LTG: Reduce frequency, intensity, and duration of depression symptoms so that daily functioning is improved (OP Depression) - STG: Natha "Ruth Escobar" will reduce frequency of avoidant behaviors by 50% as evidenced by self-report in therapy sessions (OP Depression) - LTG: "I just want to understand my emotions and triggers for depression better" (OP Depression)  ProgressTowards Goals: Progressing  Interventions: CBT, Motivational Interviewing, and Supportive  Summary: "Ruth Escobar" is a 23 y.o. Trans female with past psych history of Adjustment D/O w/ mixed Anxiety and Depressed mood, presenting for follow-up therapy session in efforts to improve management of presenting stressors.  Patient actively engaged in session, presenting in overall pleasant moods with congruent affect throughout duration of visit.  Patient actively engaged in introductory check-in, sharing of feeling content, and now just awaiting physical test for [facility] [employer] scheduled later this month.  Actively engaged in processing recent stressors throughout the past 2 weeks, noting of experiencing minimal stress and/or distress, and having adjusted perspective surrounding workplace drama, proving to primarily keep to himself and not engage.  Further processed continued efforts by patient to support and healthy weight management and progressions and physical conditioning in preparation for [employer] physical aptitude test.  Patient expressed having observed improvements in sleep, mood, mental clarity, and gut health since having started prepping meals at home and being more attentive to eating habits.  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  Patient expressed excitement for future opportunities, sharing of processing within session to be supportive.  Patient responded well to interventions. Patient continues to meet criteria for Adjustment D/O w/ mixed Anxiety and Depressed mood. Patient will continue to benefit from engagement in outpatient therapy due to being the least restrictive service to meet presenting needs.   Suicidal/Homicidal: No; No plan, no intent.  Therapist Response: Clinician utilized CBT, MI, and supportive reflections to address pt's identified stressors.   Clinician openly greeted patient, actively engaging in introductory check-in, assessing presenting moods and affect, eliciting patient's reflections of recent events and contributing factors to presenting moods.  Actively listening to the patient's reflections of recent events, noting of no significant stress, utilizing open-ended questions to support patient in processing of recent events and experienced thoughts, feelings, and perspectives.  Utilized Socratic questioning to further encourage patient's critical exploration of thoughts and perspectives.  Clinician reassessed severity of depressive and anxious sxs, and presence of any safety concerns. Therapist provided support and empathy to patient during session.   Plan: Return again in 2 weeks.  Diagnosis:  Encounter Diagnosis  Name Primary?   Adjustment disorder with mixed anxiety and depressed mood Yes     Collaboration of Care: Other none necessary at this time.  Patient/Guardian was advised Release of Information must be obtained prior to any record release in order to collaborate their care with an outside provider. Patient/Guardian was advised if they have not already done so to contact the registration department to sign all  necessary forms in order for [place] to release information regarding their care.   Consent: Patient/Guardian gives verbal consent for treatment and assignment of benefits for  services provided during this visit. Patient/Guardian expressed understanding and agreed to proceed.   [name], MSW, LCSW 12/28/23

## 2024-01-11 ENCOUNTER — Ambulatory Visit (INDEPENDENT_AMBULATORY_CARE_PROVIDER_SITE_OTHER): Admitting: Licensed Clinical Social Worker

## 2024-01-11 DIAGNOSIS — F4323 Adjustment disorder with mixed anxiety and depressed mood: Secondary | ICD-10-CM | POA: Diagnosis not present

## 2024-01-11 NOTE — Progress Notes (Signed)
 THERAPIST PROGRESS NOTE   Session Date: 01/11/2024  Session Time: 1409 - 1456  Virtual Visit via Video Note  I connected with Ruth Escobar on 01/11/24 at  2:00 PM EDT by a video enabled telemedicine application and verified that I am speaking with the correct person using two identifiers.  Location: Patient: Home Provider: [facility]   I discussed the limitations of evaluation and management by telemedicine and the availability of in person appointments. The patient expressed understanding and agreed to proceed.   The patient was advised to call back or seek an in-person evaluation if the symptoms worsen or if the condition fails to improve as anticipated.  I provided 47 minutes of non-face-to-face time during this encounter.  Participation Level: Active  Behavioral Response: CasualAlertEuthymic  Type of Therapy: Individual Therapy  Treatment Goals addressed:  - LTG: Reduce frequency, intensity, and duration of depression symptoms so that daily functioning is improved (OP Depression) - STG: Charie "[name]" will reduce frequency of avoidant behaviors by 50% as evidenced by self-report in therapy sessions (OP Depression) - LTG: "I just want to understand my emotions and triggers for depression better" (OP Depression)  ProgressTowards Goals: Progressing  Interventions: CBT, Motivational Interviewing, and Supportive  Summary: "Ruth Escobar" is a 23 y.o. Trans female with past psych history of Adjustment D/O w/ mixed Anxiety and Depressed mood, presenting for follow-up therapy session in efforts to improve management of presenting stressors.  Patient actively engaged in session, presenting in pleasant moods, sharing of feeling great other than current illness/allergy related congestion, further reporting of having woken up with a itchy throat thus believing it to be a result of allergies.  Patient actively detailed upcoming physical test for [facility] [employer], further detailing efforts  to prepare, having increased frequency of running to 2-3 times per week depending on schedule and increasing frequency of push-ups to ensure ability to meet standards.  Further processed continued reduction in workplace stressors, noting of allowing patient the ability to focus on [employer] preparation.  Patient expressed experiencing a little nervousness but overall feeling excited to pursue new venture.  Revisited patient's continued efforts at improving eating and sleep habits, noting of feeling better throughout the day having adjusted eating habits, as well as adjusted eating habits proving to improved sleep.  Patient shared of still finding self to be snacking at work throughout the night, expressing intent to obtain healthier options to take with him to work.  Patient shared of future plans to visit maternal grandmother in [place] area next weekend, expressing of enjoying spending more time with mother as of late.  Patient responded well to interventions. Patient continues to meet criteria for Adjustment D/O w/ mixed Anxiety and Depressed mood. Patient will continue to benefit from engagement in outpatient therapy due to being the least restrictive service to meet presenting needs.   Suicidal/Homicidal: No; No plan, no intent.  Therapist Response: Clinician utilized CBT, MI, and supportive reflections to address pt's identified stressors.   Clinician actively greeted patient upon joining virtual visit, engaging patient in introductory check-in, assessing presenting mood and affect, evoking patient's input regarding presenting moods and contributing factors.  Clinician actively listened to patient's recounts of daily events, noting of patient's perceived symptoms being related to allergies.  Further provided support and listening to patient's thoughts and feelings surrounding upcoming [employer] physical test, and supporting patient in processing expressed thoughts and feelings related to mild  nervousness and excitement surrounding opportunity.  Utilized CBT and MI interventions to  support patient and exploring thoughts and feelings and how patient's perspectives have changed in relation to eating and sleep habits and supporting patient and processing additional noted gains from improvements.  Briefly supported patient in exploring abilities at challenging thoughts to adjust perspective, identifying skill/techniques to utilize in future visits.  Clinician reassessed severity of depressive and anxious sxs, and presence of any safety concerns. Therapist provided support and empathy to patient during session.   Plan: Return again in 2 weeks. Explore "Challenging Anxious Thoughts" intervention at next visit.  Diagnosis:  Encounter Diagnosis  Name Primary?   Adjustment disorder with mixed anxiety and depressed mood Yes    Collaboration of Care: Other none necessary at this time.  Patient/Guardian was advised Release of Information must be obtained prior to any record release in order to collaborate their care with an outside provider. Patient/Guardian was advised if they have not already done so to contact the registration department to sign all necessary forms in order for us  to release information regarding their care.   Consent: Patient/Guardian gives verbal consent for treatment and assignment of benefits for services provided during this visit. Patient/Guardian expressed understanding and agreed to proceed.   Patsi Boots, MSW, LCSW 01/11/24

## 2024-01-19 ENCOUNTER — Other Ambulatory Visit (HOSPITAL_COMMUNITY): Payer: Self-pay

## 2024-01-25 ENCOUNTER — Ambulatory Visit (INDEPENDENT_AMBULATORY_CARE_PROVIDER_SITE_OTHER): Admitting: Licensed Clinical Social Worker

## 2024-01-25 DIAGNOSIS — F4323 Adjustment disorder with mixed anxiety and depressed mood: Secondary | ICD-10-CM

## 2024-01-25 NOTE — Progress Notes (Signed)
 THERAPIST PROGRESS NOTE   Session Date: 01/25/2024  Session Time: 1508 - 1603  Participation Level: Active  Behavioral Response: CasualAlertEuthymic  Type of Therapy: Individual Therapy  Treatment Goals addressed:  - LTG: Reduce frequency, intensity, and duration of depression symptoms so that daily functioning is improved (OP Depression) - STG: Imya "[name]" will reduce frequency of avoidant behaviors by 50% as evidenced by self-report in therapy sessions (OP Depression) - LTG: "I just want to understand my emotions and triggers for depression better" (OP Depression)  ProgressTowards Goals: Progressing  Interventions: CBT, Motivational Interviewing, and Supportive  Summary: "Glee Lana" is a 23 y.o. Trans female with past psych history of Adjustment D/O w/ mixed Anxiety and Depressed mood, presenting for follow-up therapy session in efforts to improve management of presenting stressors.  Patient actively engaged in session, presenting in overall pleasant moods and congruent affect throughout duration of visit.  Actively engaged in introductory check-in, sharing of recent challenges encountered in being able to successfully complete GPD physical testing, detailing of having successfully completed the required polygraph and comprehension portion, but proving to experience increased challenges when testing certain physical capabilities.  Patient expressed comfort in acknowledging needed areas of improvements, having established necessary plan to improve deficits.  Further detailed minimal other stressors and/or observed symptoms.  Engaged in reflection of observed progression in maintained animosity towards ex-girlfriend, sharing of having been aware of ex-girlfriend's recent birthday, and refrained from making contact.  Extensively processed contributing factors to patient's continued feelings of animosity towards ex-girlfriend, processing varying perspectives of appropriate behavior within  relationships and individuals varying perspectives of what constitutes, and decency.  Patient responded well to interventions. Patient continues to meet criteria for Adjustment D/O w/ mixed Anxiety and Depressed mood. Patient will continue to benefit from engagement in outpatient therapy due to being the least restrictive service to meet presenting needs.   Suicidal/Homicidal: No; No plan, no intent.  Therapist Response: Clinician utilized CBT, MI, and supportive reflections to address pt's identified stressors.   Clinician actively greeted patient upon entering visit, engaging in introductory check-in, assessing presenting moods and affect, and further eliciting patient's input and perspectives surrounding stressors and contributing factors to presenting moods.  Actively listened to patient's reflections of recent events, utilizing open-ended questions to further explore patient's experienced thoughts and feelings surrounding recent stressors and presenting challenges.  Provided validation the patient's expressed thoughts and feelings surrounding recent challenges.  Utilized Socratic questioning to further elicit greater critical processing of thoughts, feelings, and perspectives surrounding outcome of romantic relationship last year and patient's distortions and/or distorted expectations of others behaviors.  Clinician reassessed severity of depressive and anxious sxs, and presence of any safety concerns. Therapist provided support and empathy to patient during session.   Plan: Return again in 2 weeks.  Diagnosis:  Encounter Diagnosis  Name Primary?   Adjustment disorder with mixed anxiety and depressed mood Yes     Collaboration of Care: Other none necessary at this time.  Patient/Guardian was advised Release of Information must be obtained prior to any record release in order to collaborate their care with an outside provider. Patient/Guardian was advised if they have not already done so  to contact the registration department to sign all necessary forms in order for us  to release information regarding their care.   Consent: Patient/Guardian gives verbal consent for treatment and assignment of benefits for services provided during this visit. Patient/Guardian expressed understanding and agreed to proceed.   Patsi Boots, MSW, LCSW 01/25/24

## 2024-01-31 ENCOUNTER — Other Ambulatory Visit: Payer: Self-pay

## 2024-01-31 ENCOUNTER — Emergency Department (HOSPITAL_COMMUNITY)
Admission: EM | Admit: 2024-01-31 | Discharge: 2024-01-31 | Disposition: A | Discharge status: Home / Self Care | Attending: Emergency Medicine | Admitting: Emergency Medicine

## 2024-01-31 ENCOUNTER — Encounter (HOSPITAL_COMMUNITY): Payer: Self-pay | Admitting: Emergency Medicine

## 2024-01-31 ENCOUNTER — Encounter (HOSPITAL_COMMUNITY): Payer: Self-pay

## 2024-01-31 ENCOUNTER — Ambulatory Visit (HOSPITAL_COMMUNITY): Admission: EM | Admit: 2024-01-31 | Discharge: 2024-01-31 | Disposition: A | Discharge status: Home / Self Care

## 2024-01-31 ENCOUNTER — Emergency Department (HOSPITAL_COMMUNITY)

## 2024-01-31 DIAGNOSIS — R0602 Shortness of breath: Secondary | ICD-10-CM

## 2024-01-31 DIAGNOSIS — R0789 Other chest pain: Secondary | ICD-10-CM | POA: Diagnosis not present

## 2024-01-31 DIAGNOSIS — R918 Other nonspecific abnormal finding of lung field: Secondary | ICD-10-CM | POA: Diagnosis not present

## 2024-01-31 DIAGNOSIS — R202 Paresthesia of skin: Secondary | ICD-10-CM | POA: Diagnosis not present

## 2024-01-31 DIAGNOSIS — R079 Chest pain, unspecified: Secondary | ICD-10-CM

## 2024-01-31 DIAGNOSIS — K209 Esophagitis, unspecified without bleeding: Secondary | ICD-10-CM | POA: Insufficient documentation

## 2024-01-31 DIAGNOSIS — R590 Localized enlarged lymph nodes: Secondary | ICD-10-CM | POA: Diagnosis not present

## 2024-01-31 LAB — BASIC METABOLIC PANEL WITH GFR
Anion gap: 6 (ref 5–15)
BUN: 12 mg/dL (ref 6–20)
CO2: 22 mmol/L (ref 22–32)
Calcium: 9.1 mg/dL (ref 8.9–10.3)
Chloride: 108 mmol/L (ref 98–111)
Creatinine, Ser: 0.99 mg/dL (ref 0.44–1.00)
GFR, Estimated: >60 mL/min (ref >60)
Glucose, Bld: 120 mg/dL — ABNORMAL HIGH (ref 70–99)
Potassium: 3.8 mmol/L (ref 3.5–5.1)
Sodium: 136 mmol/L (ref 135–145)

## 2024-01-31 LAB — HCG, SERUM, QUALITATIVE: Preg, Serum: NEGATIVE

## 2024-01-31 LAB — CBC
HCT: 36 % (ref 36.0–46.0)
Hemoglobin: 11.5 g/dL — ABNORMAL LOW (ref 12.0–15.0)
MCH: 27.8 pg (ref 26.0–34.0)
MCHC: 31.9 g/dL (ref 30.0–36.0)
MCV: 87.2 fL (ref 80.0–100.0)
Platelets: 301 10*3/uL (ref 150–400)
RBC: 4.13 MIL/uL (ref 3.87–5.11)
RDW: 12.2 % (ref 11.5–15.5)
WBC: 7.5 10*3/uL (ref 4.0–10.5)
nRBC: 0 % (ref 0.0–0.2)

## 2024-01-31 LAB — TROPONIN I (HIGH SENSITIVITY): Troponin I (High Sensitivity): <2 ng/L (ref <18)

## 2024-01-31 MED ORDER — IOHEXOL 350 MG/ML SOLN
75.0000 mL | Freq: Once | INTRAVENOUS | Status: AC | PRN
  Administered 2024-01-31: 75 mL via INTRAVENOUS

## 2024-01-31 MED ORDER — SUCRALFATE 1 G PO TABS
1.0000 g | ORAL_TABLET | Freq: Three times a day (TID) | ORAL | 0 refills | Status: AC
  Filled 2024-01-31: qty 30, 8d supply, fill #0

## 2024-01-31 MED ORDER — PANTOPRAZOLE SODIUM 20 MG PO TBEC
20.0000 mg | DELAYED_RELEASE_TABLET | Freq: Every day | ORAL | 0 refills | Status: AC
  Filled 2024-01-31: qty 28, 28d supply, fill #0

## 2024-01-31 NOTE — ED Triage Notes (Signed)
 Patient reports that they have had chest tightness since 2200 last night. Patient Patient states she had 3 seconds of nausea prior to triage and reports SOB (hard to catch my breath).

## 2024-01-31 NOTE — ED Provider Notes (Addendum)
 [facility]    CSN: [id number] Arrival date & time: 01/31/24  1831      History   Chief Complaint No chief complaint on file.   HPI Ruth Escobar is a 23 y.o. adult.   Patient presents requesting evaluation for the new onset of midsternal stabbing/sharp chest discomfort that radiates into the left chest wall.  He reports that it started last night and has been persistent since.  He states the discomfort is notably worse with exertion.  Experienced some exertional shortness of breath while walking his dog which was unusual.  There was also momentary nausea today.  He has a history of anxiety so he tried hydroxyzine  without improvement.  Also has a history of asthma and tried his Albuterol  inhaler which did not help either.  No reported lower extremity/calf pain, swelling, or erythema bilaterally.  Is taking OCP. Mother had a history of DVT--->PE.  No other systemic signs of illness such as fever, cough, runny nose, sore throat, abdominal pain, vomiting, or diarrhea.  The history is provided by the patient.    Past Medical History:  Diagnosis Date   Asthma     Patient Active Problem List   Diagnosis Date Noted   Adjustment disorder with mixed anxiety and depressed mood 08/16/2023    History reviewed. No pertinent surgical history.  OB History   No obstetric history on file.      Home Medications    Prior to Admission medications   Medication Sig Start Date End Date Taking? Authorizing Provider  albuterol  (PROAIR  HFA) 108 (90 Base) MCG/ACT inhaler Inhale 2 puffs into the lungs every 6 (six) hours as needed. 03/04/23   Raspet, Erin K, PA-C  albuterol -ipratropium (COMBIVENT) 18-103 MCG/ACT inhaler Inhale 2 puffs into the lungs every 6 (six) hours as needed.    [provider]  beclomethasone (QVAR ) 80 MCG/ACT inhaler Inhale 2 puffs into the lungs 2 (two) times daily. Patient not taking: Reported on 08/01/2023 08/08/15   Hicks, Roselyn M, MD  buPROPion   (WELLBUTRIN  XL) 150 MG 24 hr tablet Take 1 tablet (150 mg total) by mouth every morning. 11/08/23 11/07/24  Levester Reagin, NP  cetirizine (ZYRTEC) 10 MG tablet Take 10 mg by mouth daily.    [provider]  fluticasone (FLONASE) 50 MCG/ACT nasal spray Place 2 sprays into both nostrils daily.    [provider]  hydrOXYzine  (ATARAX ) 10 MG tablet Take 1 tablet (10 mg total) by mouth at bedtime as needed and may repeat dose one time if needed. 11/08/23   Levester Reagin, NP  montelukast  (SINGULAIR ) 10 MG tablet Take 1 tablet (10 mg total) by mouth daily. 06/22/23     Norethindrone Acetate-Ethinyl Estrad-FE (BLISOVI 24 FE) 1-20 MG-MCG(24) tablet Blisovi 24 Fe 1 mg-20 mcg (24)/75 mg (4) tablet  TAKE 1 TABLET BY MOUTH EVERY DAY    [provider]    Family History Family History  Problem Relation Age of Onset   Cancer Mother    Cancer Father    Hypertension Father     Social History Social History   Tobacco Use   Smoking status: Never   Smokeless tobacco: Never  Vaping Use   Vaping status: Never Used  Substance Use Topics   Alcohol use: No   Drug use: No     Allergies   Bee pollen, Citrus, Pollen extract, Shellfish allergy, Iodine, and Pimecrolimus   Review of Systems Review of Systems  Constitutional:  Negative for chills  and fever.  HENT:  Negative for congestion, rhinorrhea and sore throat.   Eyes:  Negative for pain and redness.  Respiratory:  Positive for chest tightness and shortness of breath. Negative for cough.   Cardiovascular:  Positive for chest pain. Negative for palpitations and leg swelling.  Gastrointestinal:  Positive for nausea. Negative for abdominal pain, diarrhea and vomiting.  Genitourinary:  Negative for dysuria.  Musculoskeletal:  Negative for back pain and myalgias.  Skin:  Negative for rash.  Neurological:  Negative for dizziness and headaches.   Physical Exam Triage Vital Signs ED Triage Vitals [01/31/24 1919]  Encounter  Vitals Group     BP 111/77     Systolic BP Percentile      Diastolic BP Percentile      Pulse Rate 95     Resp 16     Temp 98.1 F (36.7 C)     Temp Source Oral     SpO2 96 %     Weight      Height      Head Circumference      Peak Flow      Pain Score 3     Pain Loc      Pain Education      Exclude from Growth Chart    No data found.  Updated Vital Signs BP 111/77 (BP Location: Left Arm)   Pulse 95   Temp 98.1 F (36.7 C) (Oral)   Resp 16   LMP 01/14/2024 (Approximate)   SpO2 96%   Physical Exam Vitals and nursing note reviewed.  Constitutional:      Appearance: Normal appearance.  Cardiovascular:     Rate and Rhythm: Normal rate and regular rhythm.     Heart sounds: Normal heart sounds.  Pulmonary:     Effort: Pulmonary effort is normal.     Breath sounds: Normal breath sounds.  Abdominal:     General: Bowel sounds are normal.  Neurological:     General: No focal deficit present.     Mental Status: He is alert and oriented to person, place, and time.  Psychiatric:        Mood and Affect: Mood normal.        Behavior: Behavior normal.        Thought Content: Thought content normal.        Judgment: Judgment normal.    UC Treatments / Results  Labs (all labs ordered are listed, but only abnormal results are displayed) Labs Reviewed - No data to display  EKG  Normal sinus rhythm, rate of 95, no acute ST segment changes.  Radiology No results found.  Procedures Procedures (including critical care time)  Medications Ordered in UC Medications - No data to display  Initial Impression / Assessment and Plan / UC Course  I have reviewed the triage vital signs and the nursing notes.  Pertinent labs & imaging results that were available during my care of the patient were reviewed by me and considered in my medical decision making (see chart for details).    Patient presenting for midsternal chest discomfort with radiation to the left chest wall since  last night.  There is also some mild exertional shortness of breath.  He notes that the discomfort is worsened with exertion also.  Based upon his symptoms, taking OCP, and his mother having a history of DVT with PE-I do feel additional evaluation for this is warranted.  However, this extends beyond the capabilities of the urgent  care.  Through shared decision making, he will present at the emergency department for ongoing evaluation.  He is stable for transport via private vehicle.  Final Clinical Impressions(s) / UC Diagnoses   Final diagnoses:  Chest pain, unspecified type  Exertional shortness of breath     Discharge Instructions      Based upon your symptoms and familial history of blood clots, I do feel it is important for you to have further evaluation of your chest pain.  Please present to the Emergency Department for ongoing care.   ED Prescriptions   None    PDMP not reviewed this encounter.   Genene Kennel, FNP 01/31/24 1955    Genene Kennel, FNP 01/31/24 1956    Genene Kennel, FNP 01/31/24 1958

## 2024-01-31 NOTE — ED Notes (Signed)
 Patient is being discharged from the Urgent Care and sent to the Emergency Department via POV . Per Thomas Fleischer, FNP, patient is in need of higher level of care due to chest tightness, rule out PE. Patient is aware and verbalizes understanding of plan of care.  Vitals:   01/31/24 1919  BP: 111/77  Pulse: 95  Resp: 16  Temp: 98.1 F (36.7 C)  SpO2: 96%

## 2024-01-31 NOTE — Discharge Instructions (Signed)
 Based upon your symptoms and familial history of blood clots, I do feel it is important for you to have further evaluation of your chest pain.  Please present to the Emergency Department for ongoing care.

## 2024-01-31 NOTE — Discharge Instructions (Signed)
 Please follow-up closely with your primary care doctor and gynecology on an outpatient basis for reevaluation.  Please follow-up with gastroenterology regarding the inflammation of your esophagus.  Please ensure that your primary care doctor or gynecologist reevaluate your enlarged lymph nodes.

## 2024-01-31 NOTE — ED Provider Notes (Signed)
 [place] [facility] Provider Note   CSN: [id number] Arrival date & time: 01/31/24  1956     History  Chief Complaint  Patient presents with   Chest Pain    Tossie Lantrip Daris is a 23 y.o. adult.  Patient is a 23 year old female who presents to the emergency department the chief complaint of left-sided chest pain which has been ongoing since last night.  Patient notes that she did try taking Gas-X, her anxiety medicines with no improvement in her symptoms.  Patient also notes that she began experiencing some tingling down her left arm today.  Patient denies any associated abdominal pain, nausea, vomiting, diarrhea.  She does note that she gets some intermittent shortness of breath with exertion.  She denies any associated dizziness, lightheadedness or syncope.  She denies any personal history of cardiac disease but does have a history of asthma.  She does have a family history of PE.  She denies any lower extremity pain or edema, hemoptysis.   Chest Pain      Home Medications Prior to Admission medications   Medication Sig Start Date End Date Taking? Authorizing Provider  albuterol  (PROAIR  HFA) 108 (90 Base) MCG/ACT inhaler Inhale 2 puffs into the lungs every 6 (six) hours as needed. 03/04/23   Raspet, Erin K, PA-C  albuterol -ipratropium (COMBIVENT) 18-103 MCG/ACT inhaler Inhale 2 puffs into the lungs every 6 (six) hours as needed.    [provider]  beclomethasone (QVAR ) 80 MCG/ACT inhaler Inhale 2 puffs into the lungs 2 (two) times daily. Patient not taking: Reported on 08/01/2023 08/08/15   Hicks, Roselyn M, MD  buPROPion  (WELLBUTRIN  XL) 150 MG 24 hr tablet Take 1 tablet (150 mg total) by mouth every morning. 11/08/23 11/07/24  Levester Reagin, NP  cetirizine (ZYRTEC) 10 MG tablet Take 10 mg by mouth daily.    [provider]  fluticasone (FLONASE) 50 MCG/ACT nasal spray Place 2 sprays into both nostrils daily.    [provider]  hydrOXYzine  (ATARAX ) 10 MG tablet Take 1 tablet (10 mg total) by mouth at bedtime as needed and may repeat dose one time if needed. 11/08/23   Levester Reagin, NP  montelukast  (SINGULAIR ) 10 MG tablet Take 1 tablet (10 mg total) by mouth daily. 06/22/23     Norethindrone Acetate-Ethinyl Estrad-FE (BLISOVI 24 FE) 1-20 MG-MCG(24) tablet Blisovi 24 Fe 1 mg-20 mcg (24)/75 mg (4) tablet  TAKE 1 TABLET BY MOUTH EVERY DAY    [provider]      Allergies    Bee pollen, Citrus, Pollen extract, Shellfish allergy, Pimecrolimus, and Povidone-iodine    Review of Systems   Review of Systems  Cardiovascular:  Positive for chest pain.  All other systems reviewed and are negative.   Physical Exam Updated Vital Signs BP 127/81   Pulse 81   Temp 98.8 F (37.1 C)   Resp 10   Ht 5\' 3"  (1.6 m)   Wt 99.3 kg   LMP 01/14/2024 (Approximate)   SpO2 100%   BMI 38.78 kg/m  Physical Exam Vitals and nursing note reviewed.  Constitutional:      Appearance: Normal appearance.  HENT:     Head: Normocephalic and atraumatic.     Nose: Nose normal.     Mouth/Throat:     Mouth: Mucous membranes are moist.  Eyes:     Extraocular Movements: Extraocular movements intact.     Conjunctiva/sclera: Conjunctivae normal.     Pupils:  Pupils are equal, round, and reactive to light.  Cardiovascular:     Rate and Rhythm: Normal rate and regular rhythm.     Pulses: Normal pulses.     Heart sounds: Normal heart sounds. Heart sounds not distant. No murmur heard. Pulmonary:     Effort: Pulmonary effort is normal. No tachypnea or respiratory distress.     Breath sounds: Normal breath sounds. No stridor. No decreased breath sounds, wheezing, rhonchi or rales.  Chest:     Chest wall: No mass or tenderness.  Abdominal:     General: Abdomen is flat. Bowel sounds are normal.     Palpations: Abdomen is soft.     Tenderness: There is no abdominal tenderness. There is no guarding.  Musculoskeletal:         General: Normal range of motion.     Cervical back: Normal range of motion and neck supple.     Right lower leg: No edema.     Left lower leg: No edema.  Skin:    General: Skin is warm and dry.     Findings: No ecchymosis or rash.  Neurological:     General: No focal deficit present.     Mental Status: He is alert and oriented to person, place, and time. Mental status is at baseline.     Cranial Nerves: No cranial nerve deficit.     Motor: No weakness.  Psychiatric:        Mood and Affect: Mood normal.        Behavior: Behavior normal.        Thought Content: Thought content normal.        Judgment: Judgment normal.     ED Results / Procedures / Treatments   Labs (all labs ordered are listed, but only abnormal results are displayed) Labs Reviewed  BASIC METABOLIC PANEL WITH GFR - Abnormal; Notable for the following components:      Result Value   Glucose, Bld 120 (*)    All other components within normal limits  CBC - Abnormal; Notable for the following components:   Hemoglobin 11.5 (*)    All other components within normal limits  HCG, SERUM, QUALITATIVE  TROPONIN I (HIGH SENSITIVITY)    EKG None  Radiology DG Chest 2 View Result Date: 01/31/2024 CLINICAL DATA:  Chest pain. EXAM: CHEST - 2 VIEW COMPARISON:  October 22, 2005 FINDINGS: The heart size and mediastinal contours are within normal limits. Both lungs are clear. The visualized skeletal structures are unremarkable. IMPRESSION: No active cardiopulmonary disease. Electronically Signed   By: Virgle Grime M.D.   On: 01/31/2024 20:52    Procedures Procedures    Medications Ordered in ED Medications - No data to display  ED Course/ Medical Decision Making/ A&P                                 Medical Decision Making Amount and/or Complexity of Data Reviewed Labs: ordered. Radiology: ordered.  Risk Prescription drug management.   This patient presents to the ED for concern of chest pain  differential diagnosis includes ACS, pulmonary embolus, pneumonia, pneumothorax, hemothorax, pericarditis, endocarditis, myocarditis, esophagitis    Additional history obtained:  Additional history obtained from none External records from outside source obtained and reviewed including none   Lab Tests:  I Ordered, and personally interpreted labs.  The pertinent results include: No leukocytosis, no anemia, normal kidney function, normal electrolytes, negative  troponin   Imaging Studies ordered:  I ordered imaging studies including CT of the chest I independently visualized and interpreted imaging which showed esophageal thickening, no pulmonary embolus, mild cardiomegaly I agree with the radiologist interpretation   Problem List / ED Course:  Patient is doing well at this time and is stable for discharge home.  Discussed with patient that CT scan of the chest does demonstrate bilateral axillary lymphadenopathy, esophagitis.  Patient already has close follow-up with her gynecologist tomorrow and will have them reevaluate the swollen lymph nodes to her axilla.  Did discuss with patient importance of continued close follow-up regarding these enlarged lymph nodes.  Patient will be treated symptomatically on outpatient basis for her esophagitis and recommend close follow-up with gastroenterology.  She had no indication for pulmonary embolus on CT scan.  Do not suspect ACS at this time as EKG has no acute ischemic changes and patient has negative troponin.  Symptoms have been ongoing for 24 hours and do not suspect that a serial troponin is warranted.  Abdominal exam is benign with no focal tenderness throughout do not suspect an acute intra-abdominal surgical process.  She has no indication for pneumothorax, hemothorax, pneumonia.  Do not SPECT aortic aneurysm or dissection.  Do not suspect endocarditis.  Strict turn precautions were discussed for any new or worsening symptoms.  Patient voiced  understanding and had no additional questions.   Social Determinants of Health:  None           Final Clinical Impression(s) / ED Diagnoses Final diagnoses:  None    Rx / DC Orders ED Discharge Orders     None         Emmalene Hare 01/31/24 2349    Almond Army, MD 02/02/24 2057

## 2024-01-31 NOTE — ED Triage Notes (Signed)
 Pt reports "stabbing left sided chest pain that has been going on since 10pm last night despite trying gas-X, anxiety meds, movement and such.  She is now experiencing tingling down her left arm.  She has also been experiencing SOB w/ mild exertion.

## 2024-02-01 ENCOUNTER — Other Ambulatory Visit (HOSPITAL_COMMUNITY): Payer: Self-pay

## 2024-02-01 DIAGNOSIS — Z01419 Encounter for gynecological examination (general) (routine) without abnormal findings: Secondary | ICD-10-CM | POA: Diagnosis not present

## 2024-02-01 DIAGNOSIS — Z6837 Body mass index (BMI) 37.0-37.9, adult: Secondary | ICD-10-CM | POA: Diagnosis not present

## 2024-02-01 DIAGNOSIS — N946 Dysmenorrhea, unspecified: Secondary | ICD-10-CM | POA: Diagnosis not present

## 2024-02-03 ENCOUNTER — Encounter: Payer: Self-pay | Admitting: Gastroenterology

## 2024-02-06 ENCOUNTER — Other Ambulatory Visit (HOSPITAL_COMMUNITY): Payer: Self-pay

## 2024-02-06 ENCOUNTER — Ambulatory Visit (HOSPITAL_COMMUNITY): Payer: 59 | Admitting: Family

## 2024-02-06 VITALS — BP 113/78 | HR 91 | Ht 63.0 in | Wt 220.0 lb

## 2024-02-06 DIAGNOSIS — F32A Depression, unspecified: Secondary | ICD-10-CM | POA: Insufficient documentation

## 2024-02-06 DIAGNOSIS — F321 Major depressive disorder, single episode, moderate: Secondary | ICD-10-CM

## 2024-02-06 DIAGNOSIS — R002 Palpitations: Secondary | ICD-10-CM | POA: Diagnosis not present

## 2024-02-06 DIAGNOSIS — K209 Esophagitis, unspecified without bleeding: Secondary | ICD-10-CM | POA: Diagnosis not present

## 2024-02-06 DIAGNOSIS — R599 Enlarged lymph nodes, unspecified: Secondary | ICD-10-CM | POA: Diagnosis not present

## 2024-02-06 MED ORDER — BUPROPION HCL ER (XL) 150 MG PO TB24
150.0000 mg | ORAL_TABLET | ORAL | 1 refills | Status: DC
  Filled 2024-02-06: qty 90, 90d supply, fill #0

## 2024-02-06 NOTE — Progress Notes (Signed)
 [facility] MD/PA/NP OP Progress Note  02/06/2024 3:46 PM Ruth Escobar  MRN:  [id number]  Chief Complaint: Medication management   HPI: Ruth Escobar presents for medication management follow-up appointment. Transgneder person, He/him pronouns. "He" has a diagnosis related to major depressive disorder and generalized anxiety disorder.  {"He" is currently prescribed Wellbutrin  150 mg p.o. daily which he reports "he" has been taking and tolerating well.  Denied any suicidal or homicidal ideations.    Denies auditory visual hallucinations.  States he has been attending biweekly therapy sessions and feels that her mood has stabilized.  States she had stopped attending college classes which plans to obtain [employer].  States he recently had a physical however felt by 1 minute has plans to reattempt this upcoming June.  No concerns with illicit drug use or substance abuse.  Reports a good appetite.  No other documented concerns noted at this visit.  Will make medications available for 3 months x 1 refill.  Patient to follow-up 3 months.  Support, encouragement  and reassurance was provided.  Ruth Escobar is sitting ; he is alert/oriented x 4; calm/cooperative; and mood congruent with affect.  Patient is speaking in a clear tone at moderate volume, and normal pace; with good eye contact.  His thought process is coherent and relevant; There is no indication that he is currently responding to internal/external stimuli or experiencing delusional thought content.  Patient denies suicidal/self-harm/homicidal ideation, psychosis, and paranoia.  Patient has remained calm throughout assessment and has answered questions appropriately.   Visit Diagnosis:    ICD-10-CM   1. Current moderate episode of major depressive disorder without prior episode (HCC)  F32.1       Past Psychiatric History:   Past Medical History:  Past Medical History:  Diagnosis Date   Asthma    No  past surgical history on file.  Family Psychiatric History:   Family History:  Family History  Problem Relation Age of Onset   Cancer Mother    Cancer Father    Hypertension Father     Social History:  Social History   Socioeconomic History   Marital status: Single    Spouse name: Not on file   Number of children: Not on file   Years of education: Not on file   Highest education level: Not on file  Occupational History   Not on file  Tobacco Use   Smoking status: Never   Smokeless tobacco: Never  Vaping Use   Vaping status: Never Used  Substance and Sexual Activity   Alcohol use: No   Drug use: No   Sexual activity: Not on file  Other Topics Concern   Not on file  Social History Narrative   Not on file   Social Drivers of Health   Financial Resource Strain: Not on file  Food Insecurity: Not on file  Transportation Needs: Not on file  Physical Activity: Not on file  Stress: Not on file  Social Connections: Not on file    Allergies:  Allergies  Allergen Reactions   Bee Pollen     Other reaction(s): Cough   Citrus Rash   Pollen Extract Cough   Shellfish Allergy Anaphylaxis   Pimecrolimus Rash and Other (See Comments)    Other Reaction(s): Other (See Comments)  rash  Other reaction(s): Unknown  rash   Povidone-Iodine Rash    Metabolic Disorder Labs: No results found for: "HGBA1C", "MPG" No results found for: "PROLACTIN" No results found for: "  CHOL", "TRIG", "HDL", "CHOLHDL", "VLDL", "LDLCALC" No results found for: "TSH"  Therapeutic Level Labs: No results found for: "LITHIUM" No results found for: "VALPROATE" No results found for: "CBMZ"  Current Medications: Current Outpatient Medications  Medication Sig Dispense Refill   albuterol  (PROAIR  HFA) 108 (90 Base) MCG/ACT inhaler Inhale 2 puffs into the lungs every 6 (six) hours as needed. 18 g 0   albuterol -ipratropium (COMBIVENT) 18-103 MCG/ACT inhaler Inhale 2 puffs into the lungs every 6  (six) hours as needed.     beclomethasone (QVAR ) 80 MCG/ACT inhaler Inhale 2 puffs into the lungs 2 (two) times daily. (Patient not taking: Reported on 08/01/2023) 1 Inhaler 3   buPROPion  (WELLBUTRIN  XL) 150 MG 24 hr tablet Take 1 tablet (150 mg total) by mouth every morning. 90 tablet 1   cetirizine (ZYRTEC) 10 MG tablet Take 10 mg by mouth daily.     fluticasone (FLONASE) 50 MCG/ACT nasal spray Place 2 sprays into both nostrils daily.     hydrOXYzine  (ATARAX ) 10 MG tablet Take 1 tablet (10 mg total) by mouth at bedtime as needed and may repeat dose one time if needed. 30 tablet 0   montelukast  (SINGULAIR ) 10 MG tablet Take 1 tablet (10 mg total) by mouth daily. 90 tablet 3   Norethindrone Acetate-Ethinyl Estrad-FE (BLISOVI 24 FE) 1-20 MG-MCG(24) tablet Blisovi 24 Fe 1 mg-20 mcg (24)/75 mg (4) tablet  TAKE 1 TABLET BY MOUTH EVERY DAY     pantoprazole  (PROTONIX ) 20 MG tablet Take 1 tablet (20 mg total) by mouth daily. 28 tablet 0   sucralfate  (CARAFATE ) 1 g tablet Take 1 tablet (1 g total) by mouth 4 (four) times daily -  with meals and at bedtime. 30 tablet 0   No current facility-administered medications for this visit.     Musculoskeletal: Strength & Muscle Tone: within normal limits Gait & Station: normal Patient leans: N/A  Psychiatric Specialty Exam: Review of Systems  Psychiatric/Behavioral:  Negative for decreased concentration and sleep disturbance. The patient is nervous/anxious.   All other systems reviewed and are negative.   Blood pressure 113/78, pulse 91, height 5\' 3"  (1.6 m), weight 220 lb (99.8 kg), last menstrual period 01/14/2024.Body mass index is 38.97 kg/m.  General Appearance: Casual  Eye Contact:  Good  Speech:  Clear and Coherent  Volume:  Normal  Mood:  Anxious and Depressed  Affect:  Congruent  Thought Process:  Coherent  Orientation:  Full (Time, Place, and Person)  Thought Content: Logical   Suicidal Thoughts:  No  Homicidal Thoughts:  No  Memory:   Immediate;   Good Recent;   Good  Judgement:  Good  Insight:  Good  Psychomotor Activity:  Normal  Concentration:  Concentration: Good  Recall:  Good  Fund of Knowledge: Good  Language: Good  Akathisia:  No  Handed:  Right  AIMS (if indicated): not done  Assets:  Communication Skills Desire for Improvement Resilience Social Support  ADL's:  Intact  Cognition: WNL  Sleep:  Good   Screenings: GAD-7    Advertising copywriter from 10/31/2023 in [facility] [place] [facility] from 10/17/2023 in [facility] [place] [facility] from 10/03/2023 in [facility] from 09/19/2023 in [facility] [place] [facility] from 08/30/2023 in [facility] at [facility]  Total GAD-7 Score 1 1 3 2 2       PHQ2-9    Flowsheet Row  Counselor from 10/31/2023 in [facility] at [facility] from 10/17/2023 in [facility] from 10/03/2023 in [facility] from 09/19/2023 [facility] at [facility] from 08/30/2023 in [facility] at [facility] Total Score 0 0 1 0 1  PHQ-9 Total Score 2 1 2 3 3       Flowsheet Row UC from 01/31/2024 in [facility] from 08/16/2023 in [facility] at [facility] from 08/10/2023 in [facility] at [facility]  C-SSRS RISK CATEGORY No Risk High Risk No Risk        Assessment and Plan: Ruth Escobar is a 23 year old African-American trans person prefers he had pronouns.  Reports overall mood is stabilized since following up with biweekly therapy sessions.  No concerns related to suicidal or  homicidal ideations.  Denies auditory or visual hallucinations.  Currently prescribed Wellbutrin  150 mg daily which he reports taking and tolerating well.  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 Patient to follow-up 3 months.   Collaboration of Care: Collaboration of Care: Medication Management AEB Wellbutrin  150 mg daily   Patient/Guardian was advised Release of Information must be obtained prior to any record release in order to collaborate their care with an outside provider. Patient/Guardian was advised if they have not already done so to contact the registration department to sign all necessary forms in order for us  to release information regarding their care.   Consent: Patient/Guardian gives verbal consent for treatment and assignment of benefits for services provided during this visit. Patient/Guardian expressed understanding and agreed to proceed.    Ruth Reagin, NP 02/06/2024, 3:46 PM

## 2024-02-08 ENCOUNTER — Ambulatory Visit (INDEPENDENT_AMBULATORY_CARE_PROVIDER_SITE_OTHER): Admitting: Licensed Clinical Social Worker

## 2024-02-08 DIAGNOSIS — F321 Major depressive disorder, single episode, moderate: Secondary | ICD-10-CM

## 2024-02-08 NOTE — Progress Notes (Signed)
 THERAPIST PROGRESS NOTE   Session Date: 02/08/2024  Session Time: 1405 - 1500  Participation Level: Active  Behavioral Response: CasualAlertEuthymic  Type of Therapy: Individual Therapy  Treatment Goals addressed:   Initial (2) LTG: Alece "[name]" will recognize socially inappropriate behaviors and develop alternative behaviors (Social Interpersonal Effectiveness) STG: Willow "[name]" will demonstrate interest in social activities by initiating/joining social activity without prompts (Social Interpersonal Effectiveness)  Completed/Met (4) LTG: Reduce frequency, intensity, and duration of depression symptoms so that daily functioning is improved (OP Depression) LTG: Increase coping skills to manage depression and improve ability to perform daily activities (OP Depression) STG: Caelin "[name]" will reduce frequency of avoidant behaviors by 50% as evidenced by self-report in therapy sessions (OP Depression) LTG: "I just want to understand my emotions and triggers for depression better" (OP Depression)   ProgressTowards Goals: Progressing  Interventions: CBT, Motivational Interviewing, and Supportive  Summary: "Ruth Escobar" is a 23 y.o. Trans female with past psych history of MDD, presenting for follow-up therapy session in efforts to improve management of presenting stressors.  Patient actively engaged in session, presenting an overall pleasant moods and congruent affect throughout duration of visit.  Patient actively engaged in introductory check-in, sharing factors of recent events, specifically noting of inabilities to complete retest for [employer] PT exam, further detailing barriers to doing so, sharing of having presented to urgent care and ED on 05/13 due to having experienced increased chest pain and complications with increased heart rate, worsening when lying down, sharing of increased awareness of symptoms to be concerning due to heart rate rapidly increasing when standing.   Patient extensively detailed recent encounter with the ED, having found because of symptoms, further sharing of having presented to urgent care and/or ED between 2-3 times over recent years for same complications having historically been ruled out to be anxiety and no concerns for heart, however learning of experiencing esophagitis and enlarged heart following CT scan at recent visit.  Patient further engaged in exploration of experience thoughts and for feelings surrounding delays, expressing optimism and positive feelings of having identified concerns prior to potential further damage.  Patient actively engaged in reassessing presenting depressive and anxious symptoms via PHQ-9 and GAD-7, further engaging in discussion of maintained downward trend and/or minimal symptoms.  Patient additionally engaged in 6 months review of individualized treatment goals outlined in treatment plan, determining of having met all initial outlined goals contained within plan, further developing to new goals to support patient and continued work surrounding social interactions.  Patient responded well to interventions. Patient continues to meet criteria for MDD. Patient will continue to benefit from engagement in outpatient therapy due to being the least restrictive service to meet presenting needs.   Suicidal/Homicidal: No; No plan, no intent.  Therapist Response: Clinician utilized CBT, MI, and supportive reflections to address pt's identified stressors.   Clinician actively greeted patient upon presenting for today's visit, assessing presenting mood and affect, engaging patient in introductory check-in, and further eliciting patient's recounts of the events of factors contributing to presenting moods.  Actively listened the patient's reflections of recent events, utilizing open-ended questions to further support patient in exploration of recent medical complications and stressors as well as patient's individual efforts at  managing and/or navigating stress.  Utilized Socratic questioning to further critical processing of thoughts and feelings related to presenting stressors.  Engage patient in reassessing depressive and anxious symptoms via PHQ-9 and GAD-7, and further engaged in 6 months review of individualized treatment goals outlined in treatment  plan, determining noted progressions towards identified goals and exploring additional goals for future course of treatment.  Clinician reassessed severity of depressive and anxious sxs, and presence of any safety concerns. Therapist provided support and empathy to patient during session.   Plan: Return again in 2 weeks.  Diagnosis:  Encounter Diagnosis  Name Primary?   Current moderate episode of major depressive disorder without prior episode (HCC) Yes     Collaboration of Care: Other none necessary at this time.  Patient/Guardian was advised Release of Information must be obtained prior to any record release in order to collaborate their care with an outside provider. Patient/Guardian was advised if they have not already done so to contact the registration department to sign all necessary forms in order for us  to release information regarding their care.   Consent: Patient/Guardian gives verbal consent for treatment and assignment of benefits for services provided during this visit. Patient/Guardian expressed understanding and agreed to proceed.   Patsi Boots, MSW, LCSW 02/08/24

## 2024-02-14 ENCOUNTER — Encounter (HOSPITAL_COMMUNITY): Payer: Self-pay

## 2024-02-21 DIAGNOSIS — R933 Abnormal findings on diagnostic imaging of other parts of digestive tract: Secondary | ICD-10-CM | POA: Diagnosis not present

## 2024-02-21 DIAGNOSIS — K219 Gastro-esophageal reflux disease without esophagitis: Secondary | ICD-10-CM | POA: Diagnosis not present

## 2024-02-22 ENCOUNTER — Ambulatory Visit (INDEPENDENT_AMBULATORY_CARE_PROVIDER_SITE_OTHER): Admitting: Licensed Clinical Social Worker

## 2024-02-22 DIAGNOSIS — F321 Major depressive disorder, single episode, moderate: Secondary | ICD-10-CM

## 2024-02-22 NOTE — Progress Notes (Signed)
 THERAPIST PROGRESS NOTE   Session Date: 02/22/2024  Session Time: 1010 - 1058 Virtual Visit via Video Note  I connected with Ruth Escobar on 02/22/24 at 10:00 AM EDT by a video enabled telemedicine application and verified that I am speaking with the correct person using two identifiers.  Location: Patient: In car; Parked Provider: [facility] OPT Office   I discussed the limitations of evaluation and management by telemedicine and the availability of in person appointments. The patient expressed understanding and agreed to proceed.   The patient was advised to call back or seek an in-person evaluation if the symptoms worsen or if the condition fails to improve as anticipated.  I provided 48 minutes of non-face-to-face time during this encounter.  Participation Level: Active  Behavioral Response: CasualAlertEuthymic  Type of Therapy: Individual Therapy  Treatment Goals addressed:  LTG: Ruth "[name]" will recognize socially inappropriate behaviors and develop alternative behaviors (Social Interpersonal Effectiveness) STG: Ruth "[name]" will demonstrate interest in social activities by initiating/joining social activity without prompts (Social Interpersonal Effectiveness)  ProgressTowards Goals: Progressing  Interventions: CBT, Motivational Interviewing, and Supportive  Summary: "Ruth Escobar" is a 23 y.o. Trans female with past psych history of MDD, presenting for follow-up therapy session in efforts to improve management of presenting stressors.  Patient actively engaged in session, presenting an overall pleasant moods and congruent affect throughout duration of visit.  Patient actively engaged in introductory check-in, sharing of things going well, having been at vet with dog this morning due to presenting concerns with dog's respirations and increased coughing over recent days. Pt detailed having canceled physical with [employer] due to needing to work to account for unexpected expense of vet  visit. Provided update related to maintained physical conditioning, taking it easy due to heart concerns and uncertainty while awaiting cardiology consultation. Processed feelings surrounding postponing [employer] physical, sharing of feeling "selfishly upset" but also expressed acceptance of things happening and needing to be addressed. Reports increased stress over recent weeks surrounding ongoing concerns related to irregular heartbeat and potential implications to transition in employment. Further processed thoughts, feelings, and perspectives in relation to both short and long term life goals, contributing factors related to physical health, finances, and relationship wise, and outlook for where pt see's self in 5-10 years.  Patient responded well to interventions. Patient continues to meet criteria for MDD. Patient will continue to benefit from engagement in outpatient therapy due to being the least restrictive service to meet presenting needs.   Suicidal/Homicidal: No; No plan, no intent.  Therapist Response: Clinician utilized CBT, MI, and supportive reflections to address pt's identified stressors.   Clinician actively greeted patient upon joining today's virtual visit, actively engaging in introductory check-in, assessing presenting mood and affect, and further engaging pt in reflection of recent events and factors contributing to presenting moods. Actively listened to pt's reflections of recent events and factors contributing to presenting moods, providing support and validation for thoughts and feelings surrounding recent stressors and challenges. Utilized socratic questioning to support pt in greater critical processing of identified irrational thoughts in relation to anxiousness surrounding future and transitions.  Clinician reassessed severity of depressive and anxious sxs, and presence of any safety concerns. Therapist provided support and empathy to patient during session.   Plan: Return again  in 2 weeks.  Diagnosis:  Encounter Diagnosis  Name Primary?   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  Patient/Guardian was  advised Release of Information must be obtained prior to any record release in order to collaborate their care with an outside provider. Patient/Guardian was advised if they have not already done so to contact the registration department to sign all necessary forms in order for us  to release information regarding their care.   Consent: Patient/Guardian gives verbal consent for treatment and assignment of benefits for services provided during this visit. Patient/Guardian expressed understanding and agreed to proceed.   Patsi Boots, MSW, LCSW 02/22/24 11:12 AM

## 2024-03-07 ENCOUNTER — Ambulatory Visit (INDEPENDENT_AMBULATORY_CARE_PROVIDER_SITE_OTHER): Admitting: Licensed Clinical Social Worker

## 2024-03-07 DIAGNOSIS — F321 Major depressive disorder, single episode, moderate: Secondary | ICD-10-CM

## 2024-03-07 NOTE — Progress Notes (Unsigned)
 THERAPIST PROGRESS NOTE   Session Date: 03/07/2024  Session Time: 1510 - 1605  Participation Level: Active  Behavioral Response: CasualAlertEuthymic  Type of Therapy: Individual Therapy  Treatment Goals addressed:  LTG: Palak [name] will recognize socially inappropriate behaviors and develop alternative behaviors (Social Interpersonal Effectiveness) STG: Modelle Vollmer will demonstrate interest in social activities by initiating/joining social activity without prompts (Social Interpersonal Effectiveness)  ProgressTowards Goals: Progressing  Interventions: CBT, Motivational Interviewing, and Supportive  Summary: Glee Lana is a 23 y.o. Trans female with past psych history of MDD, presenting for follow-up therapy session in efforts to improve management of presenting stressors.  Patient actively engaged in session, presenting an overall pleasant moods and congruent affect throughout duration of visit.  Patient actively engaged in introductory check-in, sharing of things having continued to go well over the past 2 weeks, detailing of having learned of dog being allergic to other dogs following that visit 2 weeks ago.  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  Briefly explored increased stress and acuity related to past employment, proving to impact overall outlook and perspective, further exploring cognitive distortions, the impact/implications on thought processes, and the role of changing thoughts process in improving moods.  Patient responded well to interventions. Patient continues to meet criteria for MDD. Patient will continue to benefit from engagement in outpatient  therapy due to being the least restrictive service to meet presenting needs.   Suicidal/Homicidal: No; No plan, no intent.  Therapist Response: Clinician utilized CBT, MI, and supportive reflections to address pt's identified stressors.   Clinician actively greeted patient upon presenting for today's visit, assessing presenting moods and affect, engaging in introductory check-in, and further eliciting pt's recounts of events of the past two weeks and factors contributing to presenting moods. Actively listened to pt's reflections of events, sharing of maintained progressions, and individual efforts in maintaining improvements. 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 Introduced 'Cognitive Distortions' handout, to support pt in exploring irrational thoughts and distorted perspectives, and the impact on overall outlook.  Clinician reassessed severity of depressive and anxious sxs, and presence of any safety concerns. Therapist provided support and empathy to patient during session.   Plan: Return again in 2 weeks.  Diagnosis:  Encounter Diagnosis  Name Primary?   Current moderate episode of major depressive disorder without prior episode (HCC) Yes     Collaboration of Care: Other none necessary at this time.  Patient/Guardian was advised Release of Information must be obtained prior to any record release in order to collaborate their care with an outside provider. Patient/Guardian was advised if they have not already done so to contact the registration department to sign all necessary forms in order for us  to release information regarding their care.   Consent: Patient/Guardian gives verbal consent for treatment and assignment of benefits for services provided during this visit.  Patient/Guardian expressed understanding and agreed to proceed.   Patsi Boots, MSW, LCSW 03/07/24 3:12 PM

## 2024-03-12 ENCOUNTER — Encounter (HOSPITAL_COMMUNITY): Payer: Self-pay

## 2024-03-12 ENCOUNTER — Ambulatory Visit (HOSPITAL_COMMUNITY)
Admission: EM | Admit: 2024-03-12 | Discharge: 2024-03-12 | Disposition: A | Discharge status: Home / Self Care | Attending: Nurse Practitioner | Admitting: Nurse Practitioner

## 2024-03-12 DIAGNOSIS — R079 Chest pain, unspecified: Secondary | ICD-10-CM

## 2024-03-12 DIAGNOSIS — K30 Functional dyspepsia: Secondary | ICD-10-CM | POA: Diagnosis not present

## 2024-03-12 MED ORDER — ALUM & MAG HYDROXIDE-SIMETH 200-200-20 MG/5ML PO SUSP
ORAL | Status: AC
  Filled 2024-03-12: qty 30

## 2024-03-12 MED ORDER — LIDOCAINE VISCOUS HCL 2 % MT SOLN
OROMUCOSAL | Status: AC
  Filled 2024-03-12: qty 15

## 2024-03-12 MED ORDER — ALUM & MAG HYDROXIDE-SIMETH 200-200-20 MG/5ML PO SUSP
30.0000 mL | Freq: Once | ORAL | Status: AC
  Administered 2024-03-12: 30 mL via ORAL

## 2024-03-12 MED ORDER — LIDOCAINE VISCOUS HCL 2 % MT SOLN
15.0000 mL | Freq: Once | OROMUCOSAL | Status: AC
  Administered 2024-03-12: 15 mL via OROMUCOSAL

## 2024-03-12 NOTE — ED Provider Notes (Signed)
 [facility]    CSN: [id number] Arrival date & time: 03/12/24  9072      History   Chief Complaint Chief Complaint  Patient presents with   Palpitations    HPI Ruth Escobar is a 23 y.o. adult.   Patient presents today with 3-hour history of substernal chest pain, increase in gas and burping, sharp left-sided rib cage pain, and left cheek feeling abnormal.  Reports the chest pain has been constant and thinks it may be related to indigestion.  States they had similar symptoms when they had of esophagitis in the past.  Took Tums and Protonix  with no improvement.  Reports last night at work, ate a hot pocket and drink some Gatorade and water which do not typically cause indigestion symptoms.  Also endorses intermittent palpitations, has an appointment with cardiology in September.  No current palpitations or shortness of breath.  Patient denies family history of heart attack or stroke at a young age.  Reports grandmother had heart failure and brother had pericarditis after viral illness.    Past Medical History:  Diagnosis Date   Asthma     Patient Active Problem List   Diagnosis Date Noted   Depression 02/06/2024   Adjustment disorder with mixed anxiety and depressed mood 08/16/2023    History reviewed. No pertinent surgical history.  OB History   No obstetric history on file.      Home Medications    Prior to Admission medications   Medication Sig Start Date End Date Taking? Authorizing Provider  albuterol  (PROAIR  HFA) 108 (90 Base) MCG/ACT inhaler Inhale 2 puffs into the lungs every 6 (six) hours as needed. 03/04/23  Yes Raspet, Erin K, PA-C  buPROPion  (WELLBUTRIN  XL) 150 MG 24 hr tablet Take 1 tablet (150 mg total) by mouth every morning. 02/06/24 02/05/25 Yes Ezzard Staci SAILOR, NP  cetirizine (ZYRTEC) 10 MG tablet Take 10 mg by mouth daily.   Yes [provider]  fluticasone (FLONASE) 50 MCG/ACT nasal spray Place 2 sprays into both nostrils  daily.   Yes [provider]  montelukast  (SINGULAIR ) 10 MG tablet Take 1 tablet (10 mg total) by mouth daily. 06/22/23  Yes   Norethindrone Acetate-Ethinyl Estrad-FE (BLISOVI 24 FE) 1-20 MG-MCG(24) tablet Blisovi 24 Fe 1 mg-20 mcg (24)/75 mg (4) tablet  TAKE 1 TABLET BY MOUTH EVERY DAY   Yes [provider]  pantoprazole  (PROTONIX ) 20 MG tablet Take 1 tablet (20 mg total) by mouth daily. 01/31/24  Yes Daralene Bruckner D, PA-C  sucralfate  (CARAFATE ) 1 g tablet Take 1 tablet (1 g total) by mouth 4 (four) times daily -  with meals and at bedtime. 01/31/24  Yes Daralene Bruckner D, PA-C  albuterol -ipratropium (COMBIVENT) 18-103 MCG/ACT inhaler Inhale 2 puffs into the lungs every 6 (six) hours as needed.    [provider]  beclomethasone (QVAR ) 80 MCG/ACT inhaler Inhale 2 puffs into the lungs 2 (two) times daily. Patient not taking: Reported on 08/01/2023 08/08/15   Hicks, Roselyn M, MD  hydrOXYzine  (ATARAX ) 10 MG tablet Take 1 tablet (10 mg total) by mouth at bedtime as needed and may repeat dose one time if needed. 11/08/23   Ezzard Staci SAILOR, NP    Family History Family History  Problem Relation Age of Onset   Cancer Mother    Cancer Father    Hypertension Father     Social History Social History   Tobacco Use   Smoking status: Never   Smokeless tobacco: Never  Vaping Use   Vaping status: Never Used  Substance Use Topics   Alcohol use: No   Drug use: No     Allergies   Bee pollen, Citrus, Pollen extract, Shellfish allergy, Pimecrolimus, and Povidone-iodine   Review of Systems Review of Systems Per HPI  Physical Exam Triage Vital Signs ED Triage Vitals [03/12/24 1020]  Encounter Vitals Group     BP 129/85     Girls Systolic BP Percentile      Girls Diastolic BP Percentile      Boys Systolic BP Percentile      Boys Diastolic BP Percentile      Pulse Rate 87     Resp 18     Temp      Temp src      SpO2 98 %     Weight      Height       Head Circumference      Peak Flow      Pain Score      Pain Loc      Pain Education      Exclude from Growth Chart    No data found.  Updated Vital Signs BP 129/85 (BP Location: Left Arm)   Pulse 87   Resp 18   LMP 03/12/2024 (Approximate)   SpO2 98%   Visual Acuity Right Eye Distance:   Left Eye Distance:   Bilateral Distance:    Right Eye Near:   Left Eye Near:    Bilateral Near:     Physical Exam Vitals and nursing note reviewed.  Constitutional:      General: He is not in acute distress.    Appearance: Normal appearance. He is not toxic-appearing.  HENT:     Head: Normocephalic and atraumatic.     Right Ear: External ear normal.     Left Ear: External ear normal.     Mouth/Throat:     Mouth: Mucous membranes are moist.     Pharynx: Oropharynx is clear.   Cardiovascular:     Rate and Rhythm: Normal rate and regular rhythm.  Pulmonary:     Effort: Pulmonary effort is normal. No respiratory distress.     Breath sounds: Normal breath sounds. No wheezing, rhonchi or rales.  Chest:     Chest wall: No mass, deformity, swelling, tenderness or edema.   Musculoskeletal:     Cervical back: Normal range of motion.  Lymphadenopathy:     Cervical: No cervical adenopathy.   Skin:    General: Skin is warm and dry.     Capillary Refill: Capillary refill takes less than 2 seconds.     Coloration: Skin is not jaundiced or pale.     Findings: No erythema.   Neurological:     Mental Status: He is alert and oriented to person, place, and time.   Psychiatric:        Behavior: Behavior is cooperative.      UC Treatments / Results  Labs (all labs ordered are listed, but only abnormal results are displayed) Labs Reviewed - No data to display  EKG   Radiology No results found.  Procedures Procedures (including critical care time)  Medications Ordered in UC Medications  alum & mag hydroxide-simeth (MAALOX/MYLANTA) 200-200-20 MG/5ML suspension 30 mL (30 mLs  Oral Given 03/12/24 1112)  lidocaine (XYLOCAINE) 2 % viscous mouth solution 15 mL (15 mLs Mouth/Throat Given 03/12/24 1112)    Initial Impression / Assessment and Plan / UC Course  I  have reviewed the triage vital signs and the nursing notes.  Pertinent labs & imaging results that were available during my care of the patient were reviewed by me and considered in my medical decision making (see chart for details).   Patient is well-appearing, normotensive, not tachycardic, not tachypneic, oxygenating well on room air.   1. Chest pain, unspecified type 2. Indigestion No red flags in history or on exam today EKG today shows normal sinus rhythm with ventricular rate of 78 bpm; no change when compared with previous EKG earlier this year GI cocktail was given with full improvement in chest pain, left-sided rib pain, and left cheek abnormal feeling, suspect likely chest pain due to indigestion Discussed supportive care at home and when to seek emergent care  The patient was given the opportunity to ask questions.  All questions answered to their satisfaction.  The patient is in agreement to this plan.   Final Clinical Impressions(s) / UC Diagnoses   Final diagnoses:  Chest pain, unspecified type  Indigestion     Discharge Instructions      EKG today looks great.  We have given you a GI cocktail which fully resolved your symptoms.  The GI cocktail has Maalox/Mylanta - you can take this every 8 hours as needed.  Continue Protonix  as prescribed.  And avoid food triggers.  Seek care if symptoms recur despite treatment.       ED Prescriptions   None    PDMP not reviewed this encounter.   Chandra Harlene LABOR, NP 03/12/24 [phone]

## 2024-03-12 NOTE — ED Triage Notes (Signed)
 Patient presents with recurrent history of heart palpation and sob. Patient has appt with cardiologist in September. Patient states she has reflux and this could be the cause of heart palpations.

## 2024-03-12 NOTE — Discharge Instructions (Addendum)
 EKG today looks great.  We have given you a GI cocktail which fully resolved your symptoms.  The GI cocktail has Maalox/Mylanta - you can take this every 8 hours as needed.  Continue Protonix  as prescribed.  And avoid food triggers.  Seek care if symptoms recur despite treatment.

## 2024-03-19 ENCOUNTER — Ambulatory Visit (INDEPENDENT_AMBULATORY_CARE_PROVIDER_SITE_OTHER): Admitting: Licensed Clinical Social Worker

## 2024-03-19 DIAGNOSIS — F321 Major depressive disorder, single episode, moderate: Secondary | ICD-10-CM

## 2024-03-19 NOTE — Progress Notes (Unsigned)
 THERAPIST PROGRESS NOTE   Session Date: 03/19/2024  Session Time: 1605 - 1719 Virtual Visit via Video Note  I connected with Ruth Escobar on 03/19/24 at  4:00 PM EDT by a video enabled telemedicine application and verified that I am speaking with the correct person using two identifiers.  Location: Patient: Home Provider: Home Office   I discussed the limitations of evaluation and management by telemedicine and the availability of in person appointments. The patient expressed understanding and agreed to proceed.   The patient was advised to call back or seek an in-person evaluation if the symptoms worsen or if the condition fails to improve as anticipated.  I provided 74 minutes of non-face-to-face time during this encounter.  Participation Level: Active  Behavioral Response: CasualAlertEuthymic  Type of Therapy: Individual Therapy  Treatment Goals addressed:  LTG: Ruth Escobar will recognize socially inappropriate behaviors and develop alternative behaviors (Social Interpersonal Effectiveness) STG: Ruth Escobar will demonstrate interest in social activities by initiating/joining social activity without prompts (Social Interpersonal Effectiveness)  ProgressTowards Goals: Progressing  Interventions: CBT, Motivational Interviewing, and Supportive  Summary: Ruth Escobar is a 23 y.o. Trans female with past psych history of MDD, presenting for follow-up therapy session in efforts to improve management of presenting stressors.  Patient actively engaged in session, presenting an overall pleasant moods and congruent affect throughout duration of visit.  Patient actively engaged in introductory check-in, detailing of recent urgent care visit due to GI concerns with continued esophageal swelling. 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 ***      Patient responded well to interventions. Patient continues to meet criteria for MDD. Patient will continue to benefit from engagement in outpatient therapy due to being the least restrictive service to meet presenting needs.   Suicidal/Homicidal: No; No plan, no intent.  Therapist Response: Clinician utilized CBT, MI, and supportive reflections to address pt's identified stressors.   Clinician actively greeted patient upon presenting for today's visit, assessing presenting moods and affect, engaging in introductory check-in, and further eliciting pt's recounts of events of the past two weeks and factors contributing to presenting moods. Actively listened to pt's reflections of events, sharing of maintained progressions, and individual efforts in maintaining improvements. 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 Introduced 'Cognitive Distortions' handout, to support pt in exploring irrational thoughts and distorted perspectives, and the impact on overall outlook.  Clinician reassessed severity of depressive and anxious sxs, and presence of any safety concerns. Therapist provided support and empathy to patient during session.   Plan: Return again in 2 weeks.  Diagnosis:  Encounter Diagnosis  Name Primary?   Current moderate episode of major depressive disorder without prior episode (HCC) Yes      Collaboration of Care: Other none necessary at this time.  Patient/Guardian was advised Release of Information must be obtained prior to any record release in order to collaborate their care with an  outside provider. Patient/Guardian was advised if they have not already done so to contact the registration department to sign all necessary forms in order for us  to release information regarding their care.  Consent: Patient/Guardian gives verbal consent for treatment and assignment of benefits for services provided during this visit. Patient/Guardian expressed understanding and agreed to proceed.   Ruth Escobar, MSW, LCSW 03/19/24 4:07 PM

## 2024-03-21 ENCOUNTER — Ambulatory Visit: Admitting: Gastroenterology

## 2024-03-27 DIAGNOSIS — R7309 Other abnormal glucose: Secondary | ICD-10-CM | POA: Diagnosis not present

## 2024-03-27 DIAGNOSIS — E669 Obesity, unspecified: Secondary | ICD-10-CM | POA: Diagnosis not present

## 2024-03-27 DIAGNOSIS — D649 Anemia, unspecified: Secondary | ICD-10-CM | POA: Diagnosis not present

## 2024-03-27 DIAGNOSIS — E559 Vitamin D deficiency, unspecified: Secondary | ICD-10-CM | POA: Diagnosis not present

## 2024-03-27 DIAGNOSIS — Z0000 Encounter for general adult medical examination without abnormal findings: Secondary | ICD-10-CM | POA: Diagnosis not present

## 2024-03-29 DIAGNOSIS — K219 Gastro-esophageal reflux disease without esophagitis: Secondary | ICD-10-CM | POA: Diagnosis not present

## 2024-03-29 DIAGNOSIS — R933 Abnormal findings on diagnostic imaging of other parts of digestive tract: Secondary | ICD-10-CM | POA: Diagnosis not present

## 2024-04-04 ENCOUNTER — Ambulatory Visit (INDEPENDENT_AMBULATORY_CARE_PROVIDER_SITE_OTHER): Admitting: Licensed Clinical Social Worker

## 2024-04-04 DIAGNOSIS — F321 Major depressive disorder, single episode, moderate: Secondary | ICD-10-CM

## 2024-04-04 DIAGNOSIS — E559 Vitamin D deficiency, unspecified: Secondary | ICD-10-CM | POA: Diagnosis not present

## 2024-04-04 DIAGNOSIS — J309 Allergic rhinitis, unspecified: Secondary | ICD-10-CM | POA: Diagnosis not present

## 2024-04-04 DIAGNOSIS — R7303 Prediabetes: Secondary | ICD-10-CM | POA: Diagnosis not present

## 2024-04-04 DIAGNOSIS — Z0001 Encounter for general adult medical examination with abnormal findings: Secondary | ICD-10-CM | POA: Diagnosis not present

## 2024-04-04 DIAGNOSIS — G4730 Sleep apnea, unspecified: Secondary | ICD-10-CM | POA: Diagnosis not present

## 2024-04-04 DIAGNOSIS — E669 Obesity, unspecified: Secondary | ICD-10-CM | POA: Diagnosis not present

## 2024-04-04 NOTE — Progress Notes (Unsigned)
 THERAPIST PROGRESS NOTE   Session Date: 04/04/2024  Session Time: 1515 - 1612  Participation Level: Active  Behavioral Response: CasualAlertEuthymic  Type of Therapy: Individual Therapy  Treatment Goals addressed:  LTG: Ruth Escobar will recognize socially inappropriate behaviors and develop alternative behaviors (Social Interpersonal Effectiveness) STG: Ruth Escobar will demonstrate interest in social activities by initiating/joining social activity without prompts (Social Interpersonal Effectiveness)  ProgressTowards Goals: Progressing  Interventions: CBT, Motivational Interviewing, and Supportive  Summary: Ruth Escobar is a 23 y.o. Trans female with past psych history of MDD, presenting for follow-up therapy session in efforts to improve management of presenting stressors.  Patient actively engaged in session, presenting an overall pleasant moods and congruent affect throughout duration of visit.  Patient actively engaged in introductory check-in, sharing Work is great, social life is fine, I'm not on speaking terms with my dad right now, further sharing of recent family meeting iccurring during recent family visit, ***      detailing of recent urgent care visit due to GI concerns with continued esophageal swelling. 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  Patient responded well to interventions. Patient continues to meet criteria for MDD. Patient will continue to benefit from engagement in outpatient therapy due to being the least restrictive service to meet presenting needs.   Suicidal/Homicidal: No; No plan, no intent.  Therapist Response: Clinician utilized CBT, MI, and supportive reflections to address pt's identified stressors.   Clinician actively greeted patient upon presenting for today's virtual visit, assessing presenting moods and affect, engaging in introductory check-in, and prompting pt's engagement in recounts of recent events and factors contributing to presenting moods. Actively listened to pt's reflections of recent Urgent Care visit surrounding ongoing upper GI complications, further processing pt's thoughts, feelings, and perspectives surrounding how concerns prove to continue to impact functioning. Utilized open ended questions to support pt in exploring thoughts, feelings, and perspectives surrounding barriers and implications of physical health concerns, and greater exploration of how this proves to impact overall moods and outlook. Provided support and validation for pt's expressed thoughts and feelings, further engaging in challenging both anxious and negative thoughts, processing irrational, unrealistic thoughts, and reframing of more rational thoughts.  Clinician reassessed severity of depressive and anxious sxs, and presence of any safety concerns. Therapist provided support and empathy to patient during session.   Plan: Return again in 2 weeks.  Diagnosis:  Encounter Diagnosis  Name Primary?   Current moderate episode of major depressive disorder without prior episode (HCC) Yes       Collaboration of Care: Other none necessary at this time.  Patient/Guardian was advised Release of  Information must be obtained prior to any record release in order to collaborate their care with an outside provider. Patient/Guardian was advised if they have not already done so to contact the registration department to sign all necessary forms in order for us  to release information regarding their care.  Consent: Patient/Guardian gives verbal consent for treatment and assignment of benefits for services provided during this visit. Patient/Guardian expressed understanding and agreed to proceed.   Ruth Escobar, MSW, LCSW 04/04/24 3:17 PM

## 2024-04-17 ENCOUNTER — Ambulatory Visit: Attending: Physician Assistant | Admitting: Physician Assistant

## 2024-04-17 ENCOUNTER — Encounter: Payer: Self-pay | Admitting: Physician Assistant

## 2024-04-17 ENCOUNTER — Ambulatory Visit: Attending: Physician Assistant

## 2024-04-17 ENCOUNTER — Encounter: Payer: Self-pay | Admitting: *Deleted

## 2024-04-17 VITALS — BP 113/78 | HR 108 | Ht 63.0 in | Wt 228.0 lb

## 2024-04-17 DIAGNOSIS — R002 Palpitations: Secondary | ICD-10-CM | POA: Diagnosis not present

## 2024-04-17 DIAGNOSIS — R072 Precordial pain: Secondary | ICD-10-CM

## 2024-04-17 DIAGNOSIS — I517 Cardiomegaly: Secondary | ICD-10-CM

## 2024-04-17 NOTE — Progress Notes (Signed)
 OFFICE NOTE:    Date:  04/17/2024  ID:  Ruth Escobar, DOB [birth year], MRN [id number] PCP: Theo Iha, MD  [facility] Providers Cardiologist:  None       Depression  Obesity Vit D deficiency  Allergic rhinitis   Asthma  Anemia       Discussed the use of AI scribe software for clinical note transcription with the patient, who gave verbal consent to proceed. History of Present Illness Ruth Escobar is a 23 y.o. adult who is referred by Theo Iha, MD for evaluation of chest pain.  Patient was seen in the urgent care 02/09/2024 for chest pain.  He was sent to the emergency room for further evaluation and management.  Troponin was negative x 1.  Potassium, creatinine were normal.  Hemoglobin was mildly low at 11.5.  Chest x-ray was normal.  Chest CT was negative for pulmonary embolism.  There was some evidence of small airways disease as well as left axillary and subpectoral lymphadenopathy and distal thoracic esophageal thickening consistent with esophagitis.  EKG was personally reviewed and demonstrated sinus rhythm.  There were no acute ST-T wave changes.  He was discharged with pantoprazole  and sucralfate .  The patient was seen in urgent care 03/12/2024 with chest pain patient had improved symptoms with GI cocktail.  EKG was personally reviewed and demonstrated no acute ST-T wave changes.  He has experienced chest pain and palpitations for the past year, with significant worsening over the last four to five months. The palpitations are described as a sensation of his heart racing, particularly during exertion, such as walking the dog or going up a hill. He also notes a sensation of his heart 'skipping a beat' or feeling like a 'drop' when lying down. No substernal chest pain or radiation to the neck or back is reported. He feels tired rather than short of breath and has not experienced any leg swelling. 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 He denies any episodes of passing out but has felt close to it. An episode two months ago involved his heart rate increasing to 140 bpm while sitting, which he initially attributed to dehydration. This episode lasted five to ten minutes.  Family history is significant for heart disease; his grandmother had heart failure and died of a heart attack in her sixties, and his uncle died suddenly in his late fifties, possibly from a heart attack. His mother had breast cancer that started in her lymph nodes.  Social history reveals that he does not smoke nicotine or use marijuana, but he does use a vape without tobacco. He is an EMT and works in the ER at [employer] and is single with no children.     ROS-See HPI    Studies Reviewed:       Chest CT 02/09/24: Mild cardiomegaly  Labs 03/09/2023: ALT 16 2024/02/09: Creatinine 0.99, potassium 3.8, Hgb 11.5 Results LABS -from primary care personally reviewed and interpreted by me today 04/17/2024 TSH: 1.45 (03/27/2024) Hb: 11.9 (03/27/2024) K: 4.3 (03/27/2024) Cr: 0.97 (03/27/2024) LDL: 78 (03/27/2024)         Physical Exam:  VS:  BP 113/78   Pulse (!) 108   Ht 5' 3 (1.6 m)   Wt 228 lb (103.4 kg)   SpO2 98%   BMI 40.39 kg/m        Wt Readings from  Last 3 Encounters:  04/17/24 228 lb (103.4 kg)  02/06/24 220 lb (99.8 kg)  01/31/24 218 lb 14.7 oz (99.3 kg)    Constitutional:      Appearance: Healthy appearance. Not in distress.  Neck:     Vascular: No carotid bruit. JVD normal.  Pulmonary:     Breath sounds: Normal breath sounds. No wheezing. No rales.  Cardiovascular:     Normal rate. Regular rhythm.     Murmurs: There is no murmur.  Edema:    Peripheral edema absent.  Abdominal:     Palpations: Abdomen is soft.       Assessment and Plan:    Assessment & Plan Precordial chest  pain Cardiomegaly Palpitations Intermittent chest pain and palpitations for the past year, worsening over the last 4-5 months. Symptoms include tachycardia during exertion, palpitations when supine, and left shoulder discomfort. No sub-sternal chest pain or radiation to neck or back. Near-syncope reported, but no syncope. EKG showed no significant findings. CT scan revealed cardiomegaly, but EKG does not suggest left ventricular hypertrophy. Recent TSH, hemoglobin, potassium normal. He does not have a lot of risk factors for ischemic heart disease other than FHx.  - Order stress echocardiogram to assess rhythm during exercise, rule out ischemic heart disease and assess for structural heart disease  - If baseline echocardiogram with stress test not adequate, will need complete echocardiogram arranged.  - Arrange Zio XT to evaluate palpitations and r/o significant arrhythmias.  - Follow up 2 mos.      Informed Consent   Shared Decision Making/Informed Consent The risks [chest pain, shortness of breath, cardiac arrhythmias, dizziness, blood pressure fluctuations, myocardial infarction, stroke/transient ischemic attack, and life-threatening complications (estimated to be 1 in 10,000)], benefits (risk stratification, diagnosing coronary artery disease, treatment guidance) and alternatives of a stress or dobutamine stress echocardiogram were discussed in detail with Ruth Escobar and he agrees to proceed.     Dispo:  Return in about 2 months (around 06/18/2024) for Follow up after testing, w/ Glendia Ferrier, PA-C.  Signed, Glendia Ferrier, PA-C

## 2024-04-17 NOTE — Progress Notes (Unsigned)
 Enrolled for [employer] to mail a ZIO XT long term holter monitor to the patients address on file.   Dr. Odis Hollingshead to read.

## 2024-04-17 NOTE — Patient Instructions (Addendum)
 Medication Instructions:  Your physician recommends that you continue on your current medications as directed. Please refer to the Current Medication list given to you today.  *If you need a refill on your cardiac medications before your next appointment, please call your pharmacy*  Lab Work: None ordered  If you have labs (blood work) drawn today and your tests are completely normal, you will receive your results only by: MyChart Message (if you have MyChart) OR A paper copy in the mail If you have any lab test that is abnormal or we need to change your treatment, we will call you to review the results.  Testing/Procedures: Your physician has requested that you have a stress echocardiogram. For further information please visit [email or web address] Please follow instruction sheet  BELOW:   1. You may take your morning medications the morning of the test  2. Light breakfast no caffeine  3. Dress prepared to exercise.  4. DO NOT use ANY caffeine or tobacco products 3 hours before appointment.  Please note: We ask at that you not bring children with you during ultrasound (echo/ vascular) testing. Due to room size and safety concerns, children are not allowed in the ultrasound rooms during exams. Our front office staff cannot provide observation of children in our lobby area while testing is being conducted. An adult accompanying a patient to their appointment will only be allowed in the ultrasound room at the discretion of the ultrasound technician under special circumstances. We apologize for any inconvenience.  Follow-Up: At [facility], you and your health needs are our priority.  As part of our continuing mission to provide you with exceptional heart care, our providers are all part of one team.  This team includes your primary Cardiologist (physician) and Advanced Practice Providers or APPs (Physician Assistants and Nurse Practitioners) who all work together to provide you  with the care you need, when you need it.  Your next appointment:   2 month(s)  Provider:   Glendia Ferrier, PA-C          We recommend signing up for the patient portal called MyChart.  Sign up information is provided on this After Visit Summary.  MyChart is used to connect with patients for Virtual Visits (Telemedicine).  Patients are able to view lab/test results, encounter notes, upcoming appointments, etc.  Non-urgent messages can be sent to your provider as well.   To learn more about what you can do with MyChart, go to [email or web address].   Other Instructions

## 2024-04-19 ENCOUNTER — Ambulatory Visit (HOSPITAL_COMMUNITY): Admitting: Licensed Clinical Social Worker

## 2024-04-19 DIAGNOSIS — F321 Major depressive disorder, single episode, moderate: Secondary | ICD-10-CM

## 2024-04-19 NOTE — Progress Notes (Signed)
 THERAPIST PROGRESS NOTE   Session Date: 04/19/2024  Session Time: 1609 - 1710  Participation Level: Active  Behavioral Response: CasualAlertEuthymic  Type of Therapy: Individual Therapy  Treatment Goals addressed:  LTG: Ruth Escobar will recognize socially inappropriate behaviors and develop alternative behaviors (Social Interpersonal Effectiveness) STG: Ruth Escobar will demonstrate interest in social activities by initiating/joining social activity without prompts (Social Interpersonal Effectiveness)  ProgressTowards Goals: Progressing  Interventions: CBT, Motivational Interviewing, and Supportive  Summary: Ruth Escobar is a 23 y.o. Trans female with past psych history of MDD, presenting for follow-up therapy session in efforts to improve management of presenting stressors.  Patient actively engaged in session, presenting in overall pleasant moods and congruent affect throughout duration of visit.  Patient actively engaged in introductory check-in, sharing Everything is alright, still not talking to dad, further detailed of having seen dad with family reunion event, noting of things still remaining unaddressed, with father acting like nothing happened, further sharing of continued awareness of stress within parents relationship surrounding father's challenges with navigating emotions and individual challenges. Pt further provided recounts of birthday, yesterday, sharing of having not done anything significant, having spent time relaxing at home and watching TV, detailing of plans having been made previously however these having been canceled by mother due to inability for other family members to attend, further processing identified thoughts and feelings surrounding disappointment. Pt also shared of having had initial consult with cardiology, being scheduled for future stress test as initial evaluation.  Patient responded well to interventions. Patient continues to meet criteria for MDD.  Patient will continue to benefit from engagement in outpatient therapy due to being the least restrictive service to meet presenting needs.   Suicidal/Homicidal: No; No plan, no intent.  Therapist Response: Clinician utilized CBT, MI, and supportive reflections to address pt's identified stressors.   Clinician actively greeted patient upon presenting for today's visit, assessing presenting moods and affect. Openly engaged pt in introductory check-in, prompting engagement in review of how things have been going over recent weeks, eliciting recounts of recent events, and factors proving to contribute to presenting moods. Utilized open ended questions in efforts to prompt exploration of thoughts, feelings, and perspectives in relation to recent events, and presenting challenges.  Actively listened to pt's recounts of events, identified feelings, and perspectives surrounding challenging interactions with father, and individual efforts at remaining firm with boundaries and expectations of how pt should be treated.  Clinician reassessed severity of depressive and anxious sxs, and presence of any safety concerns. Therapist provided support and empathy to patient during session.   Plan: Return again in 2 weeks.  Diagnosis:  Encounter Diagnosis  Name Primary?   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  Patient/Guardian was advised Release of Information must be obtained prior to any record release in order to collaborate their care with an outside provider. Patient/Guardian was advised if they have not already done so to contact the registration department to sign all necessary forms in order for us  to release information regarding their care.   Consent: Patient/Guardian gives verbal consent for treatment and assignment of benefits for services provided during this visit. Patient/Guardian  expressed understanding and agreed to proceed.   Ruth Escobar, MSW, LCSW 04/19/24 4:11 PM

## 2024-04-24 ENCOUNTER — Telehealth: Payer: Self-pay | Admitting: Physician Assistant

## 2024-04-24 NOTE — Telephone Encounter (Signed)
 I spoke with patient who reports adhesive on monitor is causing itching.  Started about 2 hours after applying monitor.   Will forward to device team for suggestions.   Patient reports a few hours after he applied monitor he started sneezing, developed a runny nose, sore throat ,cough and congestion. I asked him to follow up with PCP for these symptoms.  Patient reports he did contact PCP and it was suggested he remove the monitor but patient would prefer not to do that.  I asked him to schedule appointment with PCP if symptoms did not improve.

## 2024-04-24 NOTE — Telephone Encounter (Signed)
 Can we set him up with a different monitor that is hypoallergenic? [address] [place], [place]    04/24/2024 5:14 PM

## 2024-04-24 NOTE — Telephone Encounter (Signed)
 Patient calling in with some concerns about the heart monitor. Please advise

## 2024-04-25 NOTE — Telephone Encounter (Signed)
 Returned call to patient he states the skin itching and nose congestion has basically resolved itself. He understands he should take the monitor off if the itching becomes worse.

## 2024-05-02 ENCOUNTER — Ambulatory Visit (HOSPITAL_COMMUNITY): Admitting: Licensed Clinical Social Worker

## 2024-05-02 DIAGNOSIS — F321 Major depressive disorder, single episode, moderate: Secondary | ICD-10-CM

## 2024-05-02 DIAGNOSIS — F4321 Adjustment disorder with depressed mood: Secondary | ICD-10-CM | POA: Diagnosis not present

## 2024-05-02 NOTE — Progress Notes (Signed)
 THERAPIST PROGRESS NOTE   Session Date: 05/02/2024  Session Time: 1505 - 1603  Participation Level: Active  Behavioral Response: CasualAlertEuthymic  Type of Therapy: Individual Therapy  Treatment Goals addressed:  LTG: Juhi [name] will recognize socially inappropriate behaviors and develop alternative behaviors (Social Interpersonal Effectiveness) STG: Kaleyah Labreck will demonstrate interest in social activities by initiating/joining social activity without prompts (Social Interpersonal Effectiveness)  ProgressTowards Goals: Progressing  Interventions: CBT, Motivational Interviewing, and Supportive  Summary: [name] is a 23 y.o. Trans female with past psych history of MDD, presenting for follow-up therapy session in efforts to improve management of presenting stressors.  Patient actively engaged in session, presenting in overall pleasant moods and congruent affect throughout duration of visit.  Patient actively engaged in introductory check-in, sharing Currently at grandma's, great-aunt passed recently, further detailing of having mixed feelings due to being previously aware of her having been on borrowed time, due to health conditions over recent years. Pt shared of having grieved great-aunt's passing for a while due to being aware of her overall decline, noting of finding self experiencing sadness due to hx of relationship, proving to keep self busy/occupied with tending to dogs needs proving to support in maintaining some sense of routine, as well as having observed dog's awareness of pt's and family's increased sadness, proving to provide emotional support for pt and other family members during this time. Pt shared of having observed/experienced additional challenges surrounding sleep, attributing to sleep arrangements, and being out of own space. Pt provided further updates on status related to stressors surrounding physical health concerns, detailing of having been provided with  a heart rate monitor as advised by cardiology to assess intermittent chest pain.  Patient responded well to interventions. Patient continues to meet criteria for MDD. Patient will continue to benefit from engagement in outpatient therapy due to being the least restrictive service to meet presenting needs.   Suicidal/Homicidal: No; No plan, no intent.  Therapist Response: Clinician utilized CBT, MI, and supportive reflections to address pt's identified stressors.   Clinician actively greeted patient upon presenting for today's visit, assessing presenting moods and affect. Engaged pt in introductory check-in, utilizing open ended questions in order to evoke pt's recounts of events and recent increased stressors. Provided support and validation for pt's expressed thoughts and feelings in relation to recent events and challenges. Aiding pt in processing individual awareness and insight to observed sxs and emotional response to recent passing of great-aunt, as well as added support from dog for emotional support.  Clinician reassessed severity of depressive and anxious sxs, and presence of any safety concerns. Therapist provided support and empathy to patient during session.   Plan: Return again in 2 weeks.  Diagnosis:  Encounter Diagnoses  Name Primary?   Current moderate episode of major depressive disorder without prior episode (HCC) Yes   Grief     Collaboration of Care: Other none necessary at this time.  Patient/Guardian was advised Release of Information must be obtained prior to any record release in order to collaborate their care with an outside provider. Patient/Guardian was advised if they have not already done so to contact the registration department to sign all necessary forms in order for us  to release information regarding their care.   Consent: Patient/Guardian gives verbal consent for treatment and assignment of benefits for services provided during this visit.  Patient/Guardian expressed understanding and agreed to proceed.   Virtual Visit via Video Note  I connected with Kaileena E Matthies on 05/02/24 at  3:00  PM EDT by a video enabled telemedicine application and verified that I am speaking with the correct person using two identifiers.  Location: Patient: Family's residence Provider: Home Office   I discussed the limitations of evaluation and management by telemedicine and the availability of in person appointments. The patient expressed understanding and agreed to proceed.   The patient was advised to call back or seek an in-person evaluation if the symptoms worsen or if the condition fails to improve as anticipated.  I provided 58 minutes of non-face-to-face time during this encounter.   Lynwood JONETTA Maris, LCSW   Lynwood JONETTA Maris, MSW, LCSW 05/02/24 3:20 PM

## 2024-05-04 ENCOUNTER — Encounter (HOSPITAL_BASED_OUTPATIENT_CLINIC_OR_DEPARTMENT_OTHER): Payer: Self-pay | Admitting: Internal Medicine

## 2024-05-04 DIAGNOSIS — G4710 Hypersomnia, unspecified: Secondary | ICD-10-CM

## 2024-05-04 DIAGNOSIS — R0681 Apnea, not elsewhere classified: Secondary | ICD-10-CM

## 2024-05-04 DIAGNOSIS — R5383 Other fatigue: Secondary | ICD-10-CM

## 2024-05-04 DIAGNOSIS — R0683 Snoring: Secondary | ICD-10-CM

## 2024-05-08 ENCOUNTER — Other Ambulatory Visit (HOSPITAL_COMMUNITY): Payer: Self-pay

## 2024-05-08 ENCOUNTER — Ambulatory Visit (HOSPITAL_BASED_OUTPATIENT_CLINIC_OR_DEPARTMENT_OTHER): Admitting: Family

## 2024-05-08 VITALS — BP 120/80 | HR 86 | Wt 228.0 lb

## 2024-05-08 DIAGNOSIS — F321 Major depressive disorder, single episode, moderate: Secondary | ICD-10-CM

## 2024-05-08 MED ORDER — BUPROPION HCL ER (XL) 150 MG PO TB24
150.0000 mg | ORAL_TABLET | ORAL | 1 refills | Status: DC
  Filled 2024-05-08 – 2024-06-25 (×2): qty 90, 90d supply, fill #0

## 2024-05-08 MED ORDER — HYDROXYZINE HCL 10 MG PO TABS
10.0000 mg | ORAL_TABLET | Freq: Every evening | ORAL | 0 refills | Status: AC | PRN
  Filled 2024-05-08: qty 30, 15d supply, fill #0

## 2024-05-08 NOTE — Progress Notes (Unsigned)
 [facility] MD/PA/NP OP Progress Note  05/09/2024 8:18 AM Ruth Escobar  MRN:  [id number]  Chief Complaint: Medication management  HPI: Ruth Escobar presents to follow-up for medication management appointment.  She was seen and evaluated face-to-face by this provider.  Currently carries a diagnosis related to major depressive disorder generalized anxiety disorder.  She is prescribed Wellbutrin  150 mg daily and hydroxyzine  10 mg p.o. as needed for sleep disturbance.  States she has been taking and tolerating medications well.  Denying any medication side effects.  Does report struggling with some depressive symptoms as she states she recently buried her 30 year old grandmother.  Ruth Escobar continues to be optimistic related to joining the [employer].  States she recently had to get cleared through cardiology.  States she has a follow-up appointment with cardiology in a few weeks.  No concerns with illicit drug use or substance abuse history.  Denies suicidal or homicidal ideations.  Denies auditory or visual hallucinations.  Reports she feels calm and more relaxed since staying compliant with medication.  Reports a good appetite.  States she is resting well throughout the night.  Visit Diagnosis:    ICD-10-CM   1. Current moderate episode of major depressive disorder without prior episode (HCC)  F32.1       Past Psychiatric History:   Past Medical History:  Past Medical History:  Diagnosis Date   Asthma    No past surgical history on file.  Family Psychiatric History:   Family History:  Family History  Problem Relation Age of Onset   Cancer Mother    Cancer Father    Hypertension Father    Heart failure Paternal Grandmother 32 - 102   Heart attack Paternal Grandmother    Heart attack Paternal Uncle 21 - 25       died suddenly    Social History:  Social History   Socioeconomic History   Marital status: Single    Spouse name: Not on file   Number of children: Not on file    Years of education: Not on file   Highest education level: Not on file  Occupational History    Employer: [employer]    Industry: Other / Not Applicable    Comment: EMT at [employer]  Tobacco Use   Smoking status: Never   Smokeless tobacco: Never   Tobacco comments:    Herbal vape  Vaping Use   Vaping status: Never Used  Substance and Sexual Activity   Alcohol use: No   Drug use: No   Sexual activity: Not on file  Other Topics Concern   Not on file  Social History Narrative   Not on file   Social Drivers of Health   Financial Resource Strain: Not on file  Food Insecurity: Not on file  Transportation Needs: Not on file  Physical Activity: Not on file  Stress: Not on file  Social Connections: Not on file    Allergies:  Allergies  Allergen Reactions   Bee Pollen     Other reaction(s): Cough   Citrus Rash   Pollen Extract Cough   Shellfish Allergy Anaphylaxis   Pimecrolimus Other (See Comments) and Rash    Other Reaction(s): Other (See Comments)  rash  Other reaction(s): Unknown  rash  Other Reaction(s): Unknown   Povidone-Iodine Rash    Metabolic Disorder Labs: No results found for: HGBA1C, MPG No results found for: PROLACTIN No results found for: CHOL, TRIG, HDL, CHOLHDL, VLDL, LDLCALC No results found for: TSH  Therapeutic Level Labs: No results found for: LITHIUM No results found for: VALPROATE No results found for: CBMZ  Current Medications: Current Outpatient Medications  Medication Sig Dispense Refill   albuterol  (PROAIR  HFA) 108 (90 Base) MCG/ACT inhaler Inhale 2 puffs into the lungs every 6 (six) hours as needed. 18 g 0   buPROPion  (WELLBUTRIN  XL) 150 MG 24 hr tablet Take 1 tablet (150 mg total) by mouth every morning. 90 tablet 1   cetirizine (ZYRTEC) 10 MG tablet Take 10 mg by mouth daily.     hydrOXYzine  (ATARAX ) 10 MG tablet Take 1 tablet (10 mg total) by mouth at bedtime as needed and may repeat dose one time if  needed. 30 tablet 0   montelukast  (SINGULAIR ) 10 MG tablet Take 1 tablet (10 mg total) by mouth daily. 90 tablet 3   Norethindrone Acetate-Ethinyl Estrad-FE (BLISOVI 24 FE) 1-20 MG-MCG(24) tablet Blisovi 24 Fe 1 mg-20 mcg (24)/75 mg (4) tablet  TAKE 1 TABLET BY MOUTH EVERY DAY     pantoprazole  (PROTONIX ) 20 MG tablet Take 1 tablet (20 mg total) by mouth daily. 28 tablet 0   sucralfate  (CARAFATE ) 1 g tablet Take 1 tablet (1 g total) by mouth 4 (four) times daily -  with meals and at bedtime. (Patient taking differently: Take 1 g by mouth as needed (for gerd).) 30 tablet 0   No current facility-administered medications for this visit.     Musculoskeletal: Strength & Muscle Tone: within normal limits Gait & Station: normal Patient leans: N/A  Psychiatric Specialty Exam: Review of Systems  Blood pressure 120/80, pulse 86, weight 228 lb (103.4 kg).Body mass index is 40.39 kg/m.  General Appearance: Casual  Eye Contact:  Good  Speech:  Clear and Coherent  Volume:  Normal  Mood:  Euthymic  Affect:  Congruent  Thought Process:  Coherent  Orientation:  Full (Time, Place, and Person)  Thought Content: Logical   Suicidal Thoughts:  No  Homicidal Thoughts:  No  Memory:  Immediate;   Good Recent;   Good  Judgement:  Good  Insight:  Good  Psychomotor Activity:  Normal  Concentration:  Concentration: Good  Recall:  Good  Fund of Knowledge: Good  Language: Good  Akathisia:  No  Handed:  Right  AIMS (if indicated): not done  Assets:  Communication Skills Desire for Improvement  ADL's:  Intact  Cognition: WNL  Sleep:  Good   Screenings: GAD-7    Advertising copywriter from 02/08/2024 in [facility] [place] Counselor from 10/31/2023 in [facility] [place] Counselor from 10/17/2023 in [facility] [place] Counselor from 10/03/2023 in [facility] at  [place] Counselor from 09/19/2023 in [facility] at [facility]  Total GAD-7 Score 2 1 1 3 2    PHQ2-9    Flowsheet Row Counselor from 02/08/2024 in [facility] [place] Counselor from 10/31/2023 in [facility] [place] Counselor from 10/17/2023 in [facility] [place] Counselor from 10/03/2023 in [facility] Counselor from 09/19/2023 in [facility] Total Score 0 0 0 1 0  PHQ-9 Total Score 2 2 1 2 3    Flowsheet Row UC from 03/12/2024 in [facility] UC from 01/31/2024 in [facility] from 08/16/2023 in [facility] at [facility] RISK CATEGORY No Risk No Risk High Risk     Assessment and Plan: Ruth Escobar is a 23 year old African-American female who presents for medication management follow-up appointment.  Currently prescribed Wellbutrin  and hydroxyzine  as needed which she reports she has been taking and tolerating well.  Reports she feels her mood is stable.  She reports her recent passing of her grandmother coupled with the minor setback related to attending the [employer].  States she needed to be followed by cardiologist for medical clearance.  States she just recently returned her heart monitor and is hopeful related to the results.  No concerns related to suicidal or homicidal ideations.  Denies auditory visual hallucinations.  She continues to attend biweekly therapy sessions.  Patient to follow-up 3 months for medication management.  Support, encouragement and  reassurance was provided.  Collaboration of Care: Collaboration of Care: Medication Management AEB continue medications as directed  Patient/Guardian was advised Release of Information must be obtained prior to any  record release in order to collaborate their care with an outside provider. Patient/Guardian was advised if they have not already done so to contact the registration department to sign all necessary forms in order for us  to release information regarding their care.   Consent: Patient/Guardian gives verbal consent for treatment and assignment of benefits for services provided during this visit. Patient/Guardian expressed understanding and agreed to proceed.    Staci LOISE Kerns, NP 05/09/2024, 8:18 AM

## 2024-05-11 ENCOUNTER — Encounter (HOSPITAL_COMMUNITY): Payer: Self-pay | Admitting: *Deleted

## 2024-05-11 ENCOUNTER — Telehealth (HOSPITAL_COMMUNITY): Payer: Self-pay | Admitting: *Deleted

## 2024-05-11 NOTE — Telephone Encounter (Signed)
 Stress Echo instructions sent via [employer].

## 2024-05-15 DIAGNOSIS — R002 Palpitations: Secondary | ICD-10-CM | POA: Diagnosis not present

## 2024-05-15 DIAGNOSIS — R072 Precordial pain: Secondary | ICD-10-CM | POA: Diagnosis not present

## 2024-05-16 ENCOUNTER — Ambulatory Visit (HOSPITAL_COMMUNITY): Admitting: Licensed Clinical Social Worker

## 2024-05-16 DIAGNOSIS — F321 Major depressive disorder, single episode, moderate: Secondary | ICD-10-CM | POA: Diagnosis not present

## 2024-05-16 NOTE — Progress Notes (Signed)
 THERAPIST PROGRESS NOTE   Session Date: 05/16/2024  Session Time: 1402 - 1519  Participation Level: Active  Behavioral Response: CasualAlertEuthymic  Type of Therapy: Individual Therapy  Treatment Goals addressed:  LTG: Ruth Escobar will recognize socially inappropriate behaviors and develop alternative behaviors (Social Interpersonal Effectiveness) STG: Ruth Escobar will demonstrate interest in social activities by initiating/joining social activity without prompts (Social Interpersonal Effectiveness)  ProgressTowards Goals: Progressing  Interventions: CBT, Motivational Interviewing, and Supportive  Summary: Ruth Escobar is a 23 y.o. Trans female with past psych history of MDD, presenting for follow-up therapy session in efforts to improve management of presenting stressors.  Patient actively engaged in session, presenting in overall pleasant moods and congruent affect throughout duration of visit.  Patient actively engaged in introductory check-in, sharing Doing alright, further detailing of family being unable to catch a break, sharing of family having returned to [place] following great-aunt's funeral, and paternal uncle passing last night. Actively processed feelings and perspectives surrounding presenting challenges, observations of father's own challenges navigating feelings, and the impact this proves to have on pt's interactions with father. Processed thoughts and feelings surrounding relationship with father and current, ongoing challenges, in relation to father's avoidance of acknowledging pt's feelings surrounding father's lack of acceptance and acknowledgement of pt's feelings, expectations, and boundaries. Explored pt's perspectives surrounding boundaries and how pt believes matters with father may prove to be addressed, processing pt's lack of optimism and expectations for factors to go unresolved due to hx of relationship with father.  Patient responded well to  interventions. Patient continues to meet criteria for MDD. Patient will continue to benefit from engagement in outpatient therapy due to being the least restrictive service to meet presenting needs.   Suicidal/Homicidal: No; No plan, no intent.  Therapist Response: Clinician utilized CBT, MI, and supportive reflections to address pt's identified stressors.   Clinician openly greeted patient upon presenting for today's visit, assessing presenting moods and affect. Actively engaged pt in check-in, utilizing open ended questions in aims of evoking pt's recounts of recent events, challenges, and stressors experienced over the past two weeks. Utilized active listening techniques to support pt's reflections, utilizing socratic questioning to further elicit greater critical processing of identified thoughts, feelings, and formulated perspectives. Provided support and validation for pt's expressed thoughts and feelings in relation to recent events and challenges. Utilized psychoed, CBT, and MI to aid pt in processing perspectives, openness to change, and factors contributing to feelings, perspectives, and actions.  Clinician reassessed severity of depressive and anxious sxs, and presence of any safety concerns. Therapist provided support and empathy to patient during session.   Plan: Return again in 2 weeks.  Diagnosis:  Encounter Diagnosis  Name Primary?   Current moderate episode of major depressive disorder without prior episode (HCC) Yes     Collaboration of Care: Other none necessary at this time.  Patient/Guardian was advised Release of Information must be obtained prior to any record release in order to collaborate their care with an outside provider. Patient/Guardian was advised if they have not already done so to contact the registration department to sign all necessary forms in order for us  to release information regarding their care.   Consent: Patient/Guardian gives verbal consent for  treatment and assignment of benefits for services provided during this visit. Patient/Guardian expressed understanding and agreed to proceed.   Virtual Visit via Video Note  I connected with Ruth Escobar on 05/16/24 at  2:00 PM EDT by a video enabled telemedicine application and verified that I  am speaking with the correct person using two identifiers.  Location: Patient: Home Provider: Home Office   I discussed the limitations of evaluation and management by telemedicine and the availability of in person appointments. The patient expressed understanding and agreed to proceed.   The patient was advised to call back or seek an in-person evaluation if the symptoms worsen or if the condition fails to improve as anticipated.  I provided 77 minutes of non-face-to-face time during this encounter.  Ruth Escobar, MSW, LCSW 05/16/24 2:42 PM

## 2024-05-18 ENCOUNTER — Other Ambulatory Visit (HOSPITAL_COMMUNITY): Payer: Self-pay

## 2024-05-24 ENCOUNTER — Ambulatory Visit (HOSPITAL_COMMUNITY)

## 2024-05-24 ENCOUNTER — Ambulatory Visit (HOSPITAL_COMMUNITY): Admission: RE | Admit: 2024-05-24 | Source: Ambulatory Visit

## 2024-05-27 DIAGNOSIS — R072 Precordial pain: Secondary | ICD-10-CM | POA: Diagnosis not present

## 2024-05-27 DIAGNOSIS — R002 Palpitations: Secondary | ICD-10-CM

## 2024-05-28 ENCOUNTER — Encounter: Payer: Self-pay | Admitting: Physician Assistant

## 2024-05-28 ENCOUNTER — Ambulatory Visit: Payer: Self-pay | Admitting: Physician Assistant

## 2024-05-28 DIAGNOSIS — R072 Precordial pain: Secondary | ICD-10-CM

## 2024-05-28 DIAGNOSIS — I493 Ventricular premature depolarization: Secondary | ICD-10-CM | POA: Insufficient documentation

## 2024-05-30 ENCOUNTER — Ambulatory Visit (INDEPENDENT_AMBULATORY_CARE_PROVIDER_SITE_OTHER): Admitting: Licensed Clinical Social Worker

## 2024-05-30 DIAGNOSIS — F321 Major depressive disorder, single episode, moderate: Secondary | ICD-10-CM

## 2024-05-30 NOTE — Progress Notes (Unsigned)
 THERAPIST PROGRESS NOTE   Session Date: 05/30/2024  Session Time: 1310 - 1355  Participation Level: Active  Behavioral Response: CasualAlertDepressed  Type of Therapy: Individual Therapy  Treatment Goals addressed:  LTG: Ruth Escobar will recognize socially inappropriate behaviors and develop alternative behaviors (Social Interpersonal Effectiveness) STG: Ruth Escobar will demonstrate interest in social activities by initiating/joining social activity without prompts (Social Interpersonal Effectiveness)  ProgressTowards Goals: Progressing  Interventions: CBT, Motivational Interviewing, and Supportive  Summary: Ruth Escobar is a 23 y.o. Trans female with psych history of MDD, presenting for follow-up therapy session in efforts to improve management of presenting stressors.  Patient actively engaged in session, presenting in overall depressed moods and congruent, flat affect throughout duration of visit.  Patient openly engaged in introductory check-in, sharing It's going, further detailing of reluctance to say it's going good due to the frequency of stressors pt and family have experienced over the past two months with the passing of great-aunt, and uncle. Pt further      Doing alright, further detailing of family being unable to catch a break, sharing of family having returned to [facility] following great-aunt's funeral, and paternal uncle passing last night. Actively processed feelings and perspectives surrounding presenting challenges, observations of father's own challenges navigating feelings, and the impact this proves to have on pt's interactions with father. Processed thoughts and feelings surrounding relationship with father and current, ongoing challenges, in relation to father's avoidance of acknowledging pt's feelings surrounding father's lack of acceptance and acknowledgement of pt's feelings, expectations, and boundaries. Explored pt's perspectives surrounding boundaries  and how pt believes matters with father may prove to be addressed, processing pt's lack of optimism and expectations for factors to go unresolved due to hx of relationship with father.  Patient responded well to interventions. Patient continues to meet criteria for MDD. Patient will continue to benefit from engagement in outpatient therapy due to being the least restrictive service to meet presenting needs.   Suicidal/Homicidal: No; No plan, no intent.  Therapist Response: Clinician utilized CBT, MI, and supportive reflections to address pt's identified stressors.   Clinician openly greeted patient upon presenting for today's visit, assessing presenting moods and affect. *** Actively engaged pt in check-in, utilizing open ended questions in aims of evoking pt's recounts of recent events, challenges, and stressors experienced over the past two weeks. Utilized active listening techniques to support pt's reflections, utilizing socratic questioning to further elicit greater critical processing of identified thoughts, feelings, and formulated perspectives. Provided support and validation for pt's expressed thoughts and feelings in relation to recent events and challenges. Utilized psychoed, CBT, and MI to aid pt in processing perspectives, openness to change, and factors contributing to feelings, perspectives, and actions.  Clinician reassessed severity of depressive and anxious sxs, and presence of any safety concerns. Therapist provided support and empathy to patient during session.   Plan: Return again in 2 weeks.  Diagnosis:  Encounter Diagnosis  Name Primary?   Current moderate episode of major depressive disorder without prior episode (HCC) Yes      Collaboration of Care: Other none necessary at this time.  Patient/Guardian was advised Release of Information must be obtained prior to any record release in order to collaborate their care with an outside provider. Patient/Guardian was  advised if they have not already done so to contact the registration department to sign all necessary forms in order for us  to release information regarding their care.   Consent: Patient/Guardian gives verbal consent for treatment and assignment of benefits for  services provided during this visit. Patient/Guardian expressed understanding and agreed to proceed.    Lynwood JONETTA Maris, MSW, LCSW 05/30/24 1:11 PM

## 2024-05-31 ENCOUNTER — Ambulatory Visit: Admitting: Cardiology

## 2024-06-08 ENCOUNTER — Ambulatory Visit: Admitting: Physician Assistant

## 2024-06-13 DIAGNOSIS — Z1331 Encounter for screening for depression: Secondary | ICD-10-CM | POA: Diagnosis not present

## 2024-06-13 DIAGNOSIS — L83 Acanthosis nigricans: Secondary | ICD-10-CM | POA: Diagnosis not present

## 2024-06-13 DIAGNOSIS — F649 Gender identity disorder, unspecified: Secondary | ICD-10-CM | POA: Diagnosis not present

## 2024-06-14 ENCOUNTER — Encounter (HOSPITAL_COMMUNITY): Payer: Self-pay | Admitting: *Deleted

## 2024-06-14 ENCOUNTER — Ambulatory Visit (HOSPITAL_COMMUNITY): Admitting: Licensed Clinical Social Worker

## 2024-06-14 DIAGNOSIS — F321 Major depressive disorder, single episode, moderate: Secondary | ICD-10-CM

## 2024-06-14 NOTE — Progress Notes (Signed)
 HERAPIST PROGRESS NOTE   Session Date: 06/14/2024  Session Time: 1115 - 1208  Participation Level: Active  Behavioral Response: CasualAlertEuthymic  Type of Therapy: Individual Therapy  Treatment Goals addressed:  LTG: Fleta [name] will recognize socially inappropriate behaviors and develop alternative behaviors (Social Interpersonal Effectiveness) STG: Treniya Lobb will demonstrate interest in social activities by initiating/joining social activity without prompts (Social Interpersonal Effectiveness)  ProgressTowards Goals: Progressing  Interventions: CBT, Motivational Interviewing, and Supportive  Summary: [name] is a 23 y.o. Trans female with psych history of MDD, presenting for follow-up therapy session in efforts to improve management of presenting stressors.  Patient actively engaged in session, presenting in overall pleasant moods and congruent affect throughout duration of visit. Patient openly engaged in introductory check-in, sharing of recent weeks It's been good, compared to the last few months, further detailing of finding comfort and relaxation in mundane day-to-day tasks and responsibilities of the past two weeks in comparison to recent months and numerous family funerals. Pt engaged in actively processing how adjustments in level of chaos surrounding life events has proven to allow self time to reflect and focus on individual presenting challenges and/or stressors, further exploring how recent medical concerns having presented approx. 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 Pt expressed thoughts and feelings surrounding potential to have found self in profession that he has no desire to be in, based on hasty decisions, if medical concerns had not presented when they did. Engaged in further reflection of pt's overall desires to  maintain employment in healthcare, and uncertainties surrounding potential area of focus and/or direction to pursue, processing roles and responsibilities of RN's, PA's, and MD's, as well as the various areas of which pt would potentially find interest. Pt expressed individual interest in further exploring potential professional paths in various areas of healthcare in continued processing of life goals.  Patient responded well to interventions. Patient continues to meet criteria for MDD. Patient will continue to benefit from engagement in outpatient therapy due to being the least restrictive service to meet presenting needs.   Suicidal/Homicidal: No; No plan, no intent.  Therapist Response: Clinician utilized CBT, MI, and supportive reflections to address pt's identified stressors.   Clinician openly greeted patient upon presenting for today's visit, assessing presenting moods and affect, engaging pt in check-in, utilizing open ended questions in evoking recounts of events, identification of new stressors, recurring stressors, and individual efforts at navigating challenges. Utilized active listening techniques to support pt's recounts, providing support and validation of shared feelings and perspectives in relation to increased stress over recent months and having found self finding greater clarity of perspectives surrounding profession and mildly impulsive decision making regarding career switch. Provided support and validation of pt's expressed thoughts and feelings, supporting pt in challenging irrational thoughts and guiding to a place of more rational perspectives. Utilized CBT, MI, psychoed, and supportive reflection techniques to aid pt in processing recent events. Clinician reassessed severity of depressive and anxious sxs, and presence of any safety concerns. Pt proves to maintain moderate progressions towards tx goals.  [x]  Cognitive Challenging     [x]  Cognitive Refocusing     [x]  Cognitive  Reframing     []  Communication Skills []  Compliance Issues     []  DBT     []  Exploration of Coping Patterns     [x]  Exploration of Emotions []  Exploration of Relationship Patterns     []   Guided Imagery     [x]  Interactive Feedback     []  Interpersonal Resolutions Print production planner     [x]  Psycho-Education     []  Relaxation/Deep Breathing []  Review of Treatment Plan/Progress     []  Role-Play/Behavioral Rehearsal     [x]  Structured Problem Solving     []  Supportive Reflection []  Symptom Management     []  Other   Plan: Return again in 2 weeks.  Diagnosis:  Encounter Diagnosis  Name Primary?   Current moderate episode of major depressive disorder without prior episode (HCC) Yes   Collaboration of Care: Other none necessary at this time.  Patient/Guardian was advised Release of Information must be obtained prior to any record release in order to collaborate their care with an outside provider. Patient/Guardian was advised if they have not already done so to contact the registration department to sign all necessary forms in order for us  to release information regarding their care.   Consent: Patient/Guardian gives verbal consent for treatment and assignment of benefits for services provided during this visit. Patient/Guardian expressed understanding and agreed to proceed.    Lynwood JONETTA Maris, MSW, LCSW 06/14/24 11:17 AM

## 2024-06-18 ENCOUNTER — Emergency Department (HOSPITAL_COMMUNITY)

## 2024-06-18 ENCOUNTER — Encounter (HOSPITAL_COMMUNITY): Payer: Self-pay

## 2024-06-18 ENCOUNTER — Emergency Department (HOSPITAL_COMMUNITY)
Admission: EM | Admit: 2024-06-18 | Discharge: 2024-06-18 | Disposition: A | Discharge status: Home / Self Care | Attending: Emergency Medicine | Admitting: Emergency Medicine

## 2024-06-18 ENCOUNTER — Other Ambulatory Visit: Payer: Self-pay

## 2024-06-18 DIAGNOSIS — R0789 Other chest pain: Secondary | ICD-10-CM | POA: Insufficient documentation

## 2024-06-18 DIAGNOSIS — R002 Palpitations: Secondary | ICD-10-CM | POA: Insufficient documentation

## 2024-06-18 DIAGNOSIS — R110 Nausea: Secondary | ICD-10-CM | POA: Diagnosis not present

## 2024-06-18 DIAGNOSIS — R1084 Generalized abdominal pain: Secondary | ICD-10-CM | POA: Diagnosis not present

## 2024-06-18 DIAGNOSIS — R000 Tachycardia, unspecified: Secondary | ICD-10-CM | POA: Diagnosis not present

## 2024-06-18 DIAGNOSIS — R079 Chest pain, unspecified: Secondary | ICD-10-CM | POA: Diagnosis not present

## 2024-06-18 LAB — CBC WITH DIFFERENTIAL/PLATELET
Abs Immature Granulocytes: 0.03 K/uL (ref 0.00–0.07)
Basophils Absolute: 0 K/uL (ref 0.0–0.1)
Basophils Relative: 0 %
Eosinophils Absolute: 0.2 K/uL (ref 0.0–0.5)
Eosinophils Relative: 2 %
HCT: 34.4 % — ABNORMAL LOW (ref 36.0–46.0)
Hemoglobin: 11 g/dL — ABNORMAL LOW (ref 12.0–15.0)
Immature Granulocytes: 0 %
Lymphocytes Relative: 28 %
Lymphs Abs: 2.9 K/uL (ref 0.7–4.0)
MCH: 27.8 pg (ref 26.0–34.0)
MCHC: 32 g/dL (ref 30.0–36.0)
MCV: 86.9 fL (ref 80.0–100.0)
Monocytes Absolute: 0.7 K/uL (ref 0.1–1.0)
Monocytes Relative: 7 %
Neutro Abs: 6.5 K/uL (ref 1.7–7.7)
Neutrophils Relative %: 63 %
Platelets: 291 K/uL (ref 150–400)
RBC: 3.96 MIL/uL (ref 3.87–5.11)
RDW: 12.2 % (ref 11.5–15.5)
WBC: 10.5 K/uL (ref 4.0–10.5)
nRBC: 0 % (ref 0.0–0.2)

## 2024-06-18 LAB — COMPREHENSIVE METABOLIC PANEL WITH GFR
ALT: 22 U/L (ref 0–44)
AST: 22 U/L (ref 15–41)
Albumin: 3.3 g/dL — ABNORMAL LOW (ref 3.5–5.0)
Alkaline Phosphatase: 33 U/L — ABNORMAL LOW (ref 38–126)
Anion gap: 10 (ref 5–15)
BUN: 14 mg/dL (ref 6–20)
CO2: 20 mmol/L — ABNORMAL LOW (ref 22–32)
Calcium: 8.7 mg/dL — ABNORMAL LOW (ref 8.9–10.3)
Chloride: 108 mmol/L (ref 98–111)
Creatinine, Ser: 1.02 mg/dL — ABNORMAL HIGH (ref 0.44–1.00)
GFR, Estimated: >60 mL/min (ref >60)
Glucose, Bld: 141 mg/dL — ABNORMAL HIGH (ref 70–99)
Potassium: 4 mmol/L (ref 3.5–5.1)
Sodium: 138 mmol/L (ref 135–145)
Total Bilirubin: 0.5 mg/dL (ref 0.0–1.2)
Total Protein: 6.4 g/dL — ABNORMAL LOW (ref 6.5–8.1)

## 2024-06-18 LAB — BRAIN NATRIURETIC PEPTIDE: B Natriuretic Peptide: 9.5 pg/mL (ref 0.0–100.0)

## 2024-06-18 LAB — HCG, SERUM, QUALITATIVE: Preg, Serum: NEGATIVE

## 2024-06-18 LAB — LIPASE, BLOOD: Lipase: <10 U/L — ABNORMAL LOW (ref 11–51)

## 2024-06-18 LAB — MAGNESIUM: Magnesium: 1.8 mg/dL (ref 1.7–2.4)

## 2024-06-18 LAB — D-DIMER, QUANTITATIVE: D-Dimer, Quant: <0.27 ug{FEU}/mL (ref 0.00–0.50)

## 2024-06-18 LAB — TROPONIN I (HIGH SENSITIVITY): Troponin I (High Sensitivity): <2 ng/L (ref <18)

## 2024-06-18 MED ORDER — KETOROLAC TROMETHAMINE 15 MG/ML IJ SOLN: 15.0000 mg | Freq: Once | INTRAMUSCULAR | Status: DC

## 2024-06-18 MED ORDER — DICYCLOMINE HCL 10 MG PO CAPS
10.0000 mg | ORAL_CAPSULE | Freq: Once | ORAL | Status: AC
  Administered 2024-06-18: 10 mg via ORAL
  Filled 2024-06-18: qty 1

## 2024-06-18 MED ORDER — SODIUM CHLORIDE 0.9 % IV BOLUS
1000.0000 mL | Freq: Once | INTRAVENOUS | Status: AC
Start: 2024-06-18 — End: 2024-06-18
  Administered 2024-06-18: 1000 mL via INTRAVENOUS

## 2024-06-18 MED ORDER — ALUM & MAG HYDROXIDE-SIMETH 200-200-20 MG/5ML PO SUSP
30.0000 mL | Freq: Once | ORAL | Status: AC
  Administered 2024-06-18: 30 mL via ORAL
  Filled 2024-06-18: qty 30

## 2024-06-18 NOTE — Discharge Instructions (Signed)
 Please follow-up with your cardiologist as scheduled.  Please return for worsening or persistent symptoms.  Fever inability eat or drink.

## 2024-06-18 NOTE — ED Triage Notes (Signed)
 Pt from home, called out for 160 HR that lasted 8-10 minutes. Hx of same. HR 110-120 with EMS. VSS. axox4. EMS gave 150cc of fluids. C/O nausea and left sided chest pain. Denies SHOB.

## 2024-06-18 NOTE — ED Provider Notes (Signed)
 [place] EM[facility]GENCY DEPARTMENT AT [facility] Provider Note   CSN: [id number] Arrival date & time: 06/18/24  9157     Patient presents with: Tachycardia   Ruth Escobar is a 23 y.o. adult.   23 yo F with a chief complaints of a sensation of palpitations.  Had it happen to her twice earlier this morning.  First time happened while at work and then the second time had developed some upper and perhaps left-sided abdominal pain.  Heart rate into the 160s as documented by her watch.  Ended up calling EMS.  Had had some improvement and route with both the pain as well as the heart rate.  She has had a problem like this before.  Has seen cardiology as an outpatient.  Has had a Holter monitor has a scheduled stress echo in about 24 to 48 hours.  Denies cough congestion or fever.  Feels like she has been eating and drinking normally.        Prior to Admission medications   Medication Sig Start Date End Date Taking? Authorizing Provider  albuterol  (PROAIR  HFA) 108 (90 Base) MCG/ACT inhaler Inhale 2 puffs into the lungs every 6 (six) hours as needed. 03/04/23   Raspet, Erin K, PA-C  buPROPion  (WELLBUTRIN  XL) 150 MG 24 hr tablet Take 1 tablet (150 mg total) by mouth every morning. 05/08/24 05/08/25  Ezzard Staci SAILOR, NP  cetirizine (ZYRTEC) 10 MG tablet Take 10 mg by mouth daily.    [provider]  hydrOXYzine  (ATARAX ) 10 MG tablet Take 1 tablet (10 mg total) by mouth at bedtime as needed and may repeat dose one time if needed. 05/08/24   Ezzard Staci SAILOR, NP  montelukast  (SINGULAIR ) 10 MG tablet Take 1 tablet (10 mg total) by mouth daily. 06/22/23     Norethindrone Acetate-Ethinyl Estrad-FE (BLISOVI 24 FE) 1-20 MG-MCG(24) tablet Blisovi 24 Fe 1 mg-20 mcg (24)/75 mg (4) tablet  TAKE 1 TABLET BY MOUTH EVERY DAY    [provider]  pantoprazole  (PROTONIX ) 20 MG tablet Take 1 tablet (20 mg total) by mouth daily. 01/31/24   Daralene Lonni BIRCH, PA-C  sucralfate  (CARAFATE )  1 g tablet Take 1 tablet (1 g total) by mouth 4 (four) times daily -  with meals and at bedtime. Patient taking differently: Take 1 g by mouth as needed (for gerd). 01/31/24   Daralene Lonni BIRCH, PA-C    Allergies: Bee pollen, Citrus, Pollen extract, Shellfish allergy, Pimecrolimus, and Povidone-iodine    Review of Systems  Updated Vital Signs BP 112/76   Pulse 90   Temp 97.9 F (36.6 C) (Oral)   Resp 16   Ht 5' 3 (1.6 m)   Wt 104.3 kg   SpO2 100%   BMI 40.74 kg/m   Physical Exam Vitals and nursing note reviewed.  Constitutional:      General: He is not in acute distress.    Appearance: He is well-developed. He is not diaphoretic.  HENT:     Head: Normocephalic and atraumatic.  Eyes:     Pupils: Pupils are equal, round, and reactive to light.  Cardiovascular:     Rate and Rhythm: Normal rate and regular rhythm.     Heart sounds: No murmur heard.    No friction rub. No gallop.  Pulmonary:     Effort: Pulmonary effort is normal.     Breath sounds: No wheezing or rales.  Abdominal:     General: There is no distension.  Palpations: Abdomen is soft.     Tenderness: There is no abdominal tenderness.     Comments: Benign abdominal exam  Musculoskeletal:        General: No tenderness.     Cervical back: Normal range of motion and neck supple.  Skin:    General: Skin is warm and dry.  Neurological:     Mental Status: He is alert and oriented to person, place, and time.  Psychiatric:        Behavior: Behavior normal.     (all labs ordered are listed, but only abnormal results are displayed) Labs Reviewed  CBC WITH DIFFERENTIAL/PLATELET - Abnormal; Notable for the following components:      Result Value   Hemoglobin 11.0 (*)    HCT 34.4 (*)    All other components within normal limits  COMPREHENSIVE METABOLIC PANEL WITH GFR - Abnormal; Notable for the following components:   CO2 20 (*)    Glucose, Bld 141 (*)    Creatinine, Ser 1.02 (*)    Calcium 8.7 (*)     Albumin 3.3 (*)    Alkaline Phosphatase 33 (*)    All other components within normal limits  LIPASE, BLOOD - Abnormal; Notable for the following components:   Lipase <10 (*)    All other components within normal limits  BRAIN NATRIURETIC PEPTIDE  D-DIMER, QUANTITATIVE  HCG, SERUM, QUALITATIVE  MAGNESIUM  TROPONIN I (HIGH SENSITIVITY)    EKG: EKG Interpretation Date/Time:  Monday June 18 2024 09:39:55 EDT Ventricular Rate:  100 PR Interval:  134 QRS Duration:  73 QT Interval:  314 QTC Calculation: 405 R Axis:   20  Text Interpretation: Sinus tachycardia Low voltage, precordial leads Borderline T abnormalities, anterior leads No significant change since last tracing Confirmed by Emil Share [phone]) on 06/18/2024 9:47:12 AM  Radiology: [facility] Chest Port 1 View Result Date: 06/18/2024 CLINICAL DATA:  Palpitations. EXAM: PORTABLE CHEST 1 VIEW COMPARISON:  01/31/2024. FINDINGS: The heart size and mediastinal contours are unchanged. No focal consolidation, pleural effusion, or pneumothorax. No acute osseous abnormality. IMPRESSION: No acute cardiopulmonary findings. Electronically Signed   By: Harrietta Sherry M.D.   On: 06/18/2024 09:24     .1-3 Lead EKG Interpretation  Performed by: Emil Share, DO Authorized by: Emil Share, DO     Interpretation: normal     ECG rate:  90   ECG rate assessment: normal     Rhythm: sinus rhythm     Ectopy: none     Conduction: normal      Medications Ordered in the ED  sodium chloride 0.9 % bolus 1,000 mL (1,000 mLs Intravenous New Bag/Given 06/18/24 0901)  alum & mag hydroxide-simeth (MAALOX/MYLANTA) 200-200-20 MG/5ML suspension 30 mL (30 mLs Oral Given 06/18/24 0955)  dicyclomine (BENTYL) capsule 10 mg (10 mg Oral Given 06/18/24 0954)                                    Medical Decision Making Amount and/or Complexity of Data Reviewed Labs: ordered. Radiology: ordered.  Risk OTC drugs. Prescription drug management.   23 yo F with  a chief complaints of palpitations.  She has had these off and on in the past.  Had been seen by cardiology for them.  This time she had some abdominal pain preceding.  That seems to have resolved.  Has benign abdominal exam.  Mildly tachycardic on arrival.  Heart rate  as high as the 160s per EMS.  Sinus tachycardia on their monitor.  Workup here largely unremarkable.  D-dimer negative pregnancy negative, no significant electrolyte abnormalities.  No acute anemia.  Chest x-ray independently interpreted by me without focal infiltrate or pneumothorax.  Patient had recurrence of her symptoms.  Repeat EKG without significant change.  LFTs and lipase are unremarkable.  I discussed results with patient and family.  She has scheduled follow-up with her cardiologist on Thursday of this week.  Encouraged her to let them know about this visit.  10:29 AM:  I have discussed the diagnosis/risks/treatment options with the patient and family.  Evaluation and diagnostic testing in the emergency department does not suggest an emergent condition requiring admission or immediate intervention beyond what has been performed at this time.  They will follow up with PCP, Cards. We also discussed returning to the ED immediately if new or worsening sx occur. We discussed the sx which are most concerning (e.g., sudden worsening pain, fever, inability to tolerate by mouth) that necessitate immediate return. Medications administered to the patient during their visit and any new prescriptions provided to the patient are listed below.  Medications given during this visit Medications  sodium chloride 0.9 % bolus 1,000 mL (1,000 mLs Intravenous New Bag/Given 06/18/24 0901)  alum & mag hydroxide-simeth (MAALOX/MYLANTA) 200-200-20 MG/5ML suspension 30 mL (30 mLs Oral Given 06/18/24 0955)  dicyclomine (BENTYL) capsule 10 mg (10 mg Oral Given 06/18/24 0954)     The patient appears reasonably screen and/or stabilized for discharge  and I doubt any other medical condition or other [facility] requiring further screening, evaluation, or treatment in the ED at this time prior to discharge.       Final diagnoses:  Palpitations  Generalized abdominal pain    ED Discharge Orders     None          Emil Share, DO 06/18/24 1029

## 2024-06-21 ENCOUNTER — Ambulatory Visit (HOSPITAL_COMMUNITY)
Admission: RE | Admit: 2024-06-21 | Discharge: 2024-06-21 | Disposition: A | Discharge status: Home / Self Care | Source: Ambulatory Visit | Attending: Cardiology | Admitting: Cardiology

## 2024-06-21 ENCOUNTER — Ambulatory Visit (HOSPITAL_COMMUNITY)
Admission: RE | Admit: 2024-06-21 | Discharge: 2024-06-21 | Disposition: A | Source: Ambulatory Visit | Attending: Cardiology

## 2024-06-21 DIAGNOSIS — R002 Palpitations: Secondary | ICD-10-CM | POA: Diagnosis not present

## 2024-06-21 DIAGNOSIS — R072 Precordial pain: Secondary | ICD-10-CM | POA: Diagnosis not present

## 2024-06-21 LAB — ECHOCARDIOGRAM STRESS TEST
Area-P 1/2: 4.25 cm2
S' Lateral: 3 cm

## 2024-06-21 MED ORDER — PERFLUTREN LIPID MICROSPHERE
1.0000 mL | INTRAVENOUS | Status: AC | PRN
  Administered 2024-06-21 (×3): 2 mL via INTRAVENOUS

## 2024-06-22 ENCOUNTER — Encounter: Payer: Self-pay | Admitting: Physician Assistant

## 2024-06-22 DIAGNOSIS — R072 Precordial pain: Secondary | ICD-10-CM | POA: Insufficient documentation

## 2024-06-22 NOTE — Progress Notes (Signed)
 Patient viewed in mychart of his Echo.    Last read by Nestora FORBES Morrie Tereasa at 10:56AM on 06/22/2024.

## 2024-06-25 ENCOUNTER — Other Ambulatory Visit (HOSPITAL_COMMUNITY): Payer: Self-pay

## 2024-06-25 ENCOUNTER — Ambulatory Visit (HOSPITAL_COMMUNITY): Admitting: Licensed Clinical Social Worker

## 2024-06-25 ENCOUNTER — Other Ambulatory Visit: Payer: Self-pay

## 2024-06-25 DIAGNOSIS — F321 Major depressive disorder, single episode, moderate: Secondary | ICD-10-CM | POA: Diagnosis not present

## 2024-06-25 NOTE — Progress Notes (Unsigned)
 HERAPIST PROGRESS NOTE   Session Date: 06/25/2024  Session Time: 1411 - 1538  Participation Level: Active  Behavioral Response: CasualAlertEuthymic  Type of Therapy: Individual Therapy  Treatment Goals addressed:  LTG: Ruth Escobar will recognize socially inappropriate behaviors and develop alternative behaviors (Social Interpersonal Effectiveness) STG: Ruth Escobar will demonstrate interest in social activities by initiating/joining social activity without prompts (Social Interpersonal Effectiveness)  ProgressTowards Goals: Progressing  Interventions: CBT, Motivational Interviewing, and Supportive  Summary: Ruth Escobar is a 23 y.o. Trans female with psych history of MDD, presenting for follow-up therapy session in efforts to improve management of presenting stressors.  Patient actively engaged in session, presenting in overall pleasant moods and congruent affect throughout duration of visit. Patient openly engaged in introductory check-in, sharing of doing well, having worked last night, working the past 4 nights, finding self feeling improvements and changes in relationship dynamic with co-worker with whom pt previously had a supportive relationship, however proved to diminish as a result of workplace conflict/drama resulting in lack of communication and/or interactions over the past 8+ months. Processed ways in which pt may prove to navigate improvements in relationships and desires to speak to previous factors that lead to concerns and decline in relationship. Pt shared of things going well overall without noting of any additional or newly identified stressors, sharing further of having started testosterone shots yesterday as well as having received documentation from [facility] to support in name change process. Further explored pt's thoughts, feelings, and awareness of resources to include local community supports available to Ruth Escobar populations, exploring possible added supports via [employer] in [place] and [employer]. Actively explored pt's thoughts, feelings, and perspectives surrounding transition process, support and understanding, or lack thereof, from family members, and feelings surrounding navigating interactions and conversations with family members.  Suicidal/Homicidal: No; No plan, no intent.  Therapist Response:  Clinician openly greeted patient upon presenting for today's visit, assessing presenting moods and affect, engaging pt in check-in, exploring daily events and presenting moods, further utilizing open ended questions in evoking recounts of events, exploring new and recurring stressors, observed implications on moods, and individual efforts at navigating challenges. Utilized active listening techniques to support pt's recounts, providing support and validation of identified feelings and perspectives in relation to identified challenges and events. Utilized CBT, MI, psychoed, and supportive reflection techniques to aid pt in processing presenting challenges and determining actionable steps pt can engage in in means of navigating. Clinician reassessed severity of depressive and anxious sxs, and presence of any safety concerns.   [x]  Cognitive Challenging [x]  Cognitive Refocusing []  Cognitive Reframing  [x]  Communication Skills []  Compliance Issues []  DBT []  Exploration of Coping Patterns []  Exploration of Emotions [x]  Exploration of Relationship Patterns []  Guided Imagery []  Interactive Feedback [x]  Interpersonal Resolutions []  Mindfulness Training []  Preventative Services []  Psycho-Education []  Relaxation/Deep Breathing []  Review of Treatment Plan/Progress []  Role-Play/Behavioral Rehearsal [x]  Structured Problem Solving [x]  Supportive Reflection []  Symptom Management []  Other   Patient responded well to interventions. Patient continues to meet criteria for MDD. Patient will continue to benefit from engagement in outpatient therapy due to being  the least restrictive service to meet presenting needs. Pt proves to maintain moderate progressions towards tx goals.  Plan: Return again in 2 weeks.  Diagnosis:  Encounter Diagnosis  Name Primary?   Current moderate episode of major depressive disorder without prior episode (HCC) Yes   Collaboration of Care: Other none necessary at this time.  Patient/Guardian was advised Release of Information  must be obtained prior to any record release in order to collaborate their care with an outside provider. Patient/Guardian was advised if they have not already done so to contact the registration department to sign all necessary forms in order for us  to release information regarding their care.   Consent: Patient/Guardian gives verbal consent for treatment and assignment of benefits for services provided during this visit. Patient/Guardian expressed understanding and agreed to proceed.   Virtual Visit via Video Note  I connected with Ruth Escobar on 06/25/24 at  2:00 PM EDT by a video enabled telemedicine application and verified that I am speaking with the correct person using two identifiers.  Location: Patient: Home Provider: Home Office   I discussed the limitations of evaluation and management by telemedicine and the availability of in person appointments. The patient expressed understanding and agreed to proceed.  I discussed the assessment and treatment plan with the patient. The patient was provided an opportunity to ask questions and all were answered. The patient agreed with the plan and demonstrated an understanding of the instructions.   The patient was advised to call back or seek an in-person evaluation if the symptoms worsen or if the condition fails to improve as anticipated.  I provided 87 minutes of non-face-to-face time during this encounter.   Lynwood JONETTA Maris, MSW, LCSW 06/25/24 2:12 PM

## 2024-06-29 ENCOUNTER — Other Ambulatory Visit: Payer: Self-pay | Admitting: Medical Genetics

## 2024-06-29 DIAGNOSIS — Z006 Encounter for examination for normal comparison and control in clinical research program: Secondary | ICD-10-CM

## 2024-07-03 ENCOUNTER — Encounter (HOSPITAL_BASED_OUTPATIENT_CLINIC_OR_DEPARTMENT_OTHER): Admitting: Internal Medicine

## 2024-07-04 NOTE — Progress Notes (Unsigned)
 OFFICE NOTE:    Date:  07/09/2024  ID:  Ruth Escobar, DOB [birth year], MRN [id number] PCP: Theo Iha, MD  [facility] Providers Cardiologist:  None        NSVT Monitor 04/2024: NSR, NSVT x 1 (19 beats); no A-fib/SVT/high-grade heart block or pauses  TTE 07/05/24: EF 55-60, no RWMA, RV normal, NL RVSF, trivial MR  Chest pain  ETT-Echocardiogram 06/21/24: no ischemia Depression  Obesity Vit D deficiency  Allergic rhinitis   Asthma  Anemia         Discussed the use of AI scribe software for clinical note transcription with the patient, who gave verbal consent to proceed. History of Present Illness Ruth Escobar is a 23 y.o. adult who returns for follow up of palpitations and chest pain.   Pt was seen in 03/2024 for chest pain and palpitations. Zio XT monitor showed NSR, one episode of NSVT x 19 beats and rare PVCs. Stress echocardiogram was normal. Complete Echocardiogram showednormal EF and valvular heart disease. There was no LVH and normal RVSF. Pt went to the ED on 06/18/24 for palpitations. HR reportedly as high as 160s. DDimer, BNP, hsTrop x 1 were all normal. EKG showed sinus rhythm, HR 100, non-specific ST-TW changes. Pt reached out via MyChart regarding +/- POTS. Question POTS vs inappropriate sinus tachy.  With the episode on September 29, his heart rate reached the 160s for about 8-12 minutes. Symptoms included heart pounding, arm numbness, weakness, and significant pain. This was the second episode within 12 hours, with the first occurring around 3 AM while at work, lasting 2-3 minutes. His heart rate can drop to the 40s before spiking to the 160s during these episodes. He experiences dizziness to the point of near syncope, especially with rapid positional changes like bending down and standing up, with heart rates sometimes reaching the 140s to 150s. He wears compression socks at work but has not tried an abdominal binder. There has been no recent  syncope.  He has a history of asthma, which is well-controlled, and rarely uses his albuterol  inhaler. He is on Wellbutrin  since November, but his symptoms predate this medication.     ROS-See HPI    Studies Reviewed:       03/27/24: TSH 1.45 06/18/24: K 4, Cr 1.02 (?), Ca 8.7, TP 6.4, Alb 3.3, ALT 22, Hgb 11, Magnesium 1.8         Physical Exam:  VS:  BP 116/82   Pulse 85   Ht 5' 3 (1.6 m)   Wt 233 lb (105.7 kg)   SpO2 99%   BMI 41.27 kg/m     Orthostatic VS for the past 24 hrs (Last 3 readings):  BP- Lying Pulse- Lying BP- Sitting Pulse- Sitting BP- Standing at 0 minutes Pulse- Standing at 0 minutes BP- Standing at 3 minutes Pulse- Standing at 3 minutes  07/09/24 0806 102/69 90 116/82 87 114/80 92 113/80 98    Wt Readings from Last 3 Encounters:  07/09/24 233 lb (105.7 kg)  06/18/24 230 lb (104.3 kg)  04/17/24 228 lb (103.4 kg)    Constitutional:      Appearance: Healthy appearance. Not in distress.  Neck:     Vascular: JVD normal.  Pulmonary:     Breath sounds: Normal breath sounds. No wheezing. No rales.  Cardiovascular:     Normal rate. Regular rhythm.     Murmurs: There is no murmur.  Edema:    Peripheral edema absent.  Abdominal:     Palpations: Abdomen is soft.       Assessment and Plan:    Assessment & Plan Sinus tachycardia Palpitations Intermittent palpitations and sinus tachycardia with orthostatic symptoms, including dizziness and near syncope upon positional changes. Heart monitor showed sinus rhythm with one run of NSVT, but no atrial fibrillation or SVT. Echocardiogram and stress echo were normal. Orthostatic vital signs did not show significant blood pressure drop or heart rate increase, but high baseline heart rate noted. Differential includes POTS and inappropriate sinus tachycardia. Symptoms include heart racing, chest discomfort, and dizziness, particularly with positional changes. No syncope reported except one remote episode. Symptoms predate  Wellbutrin  use. Discussed potential triggers and management strategies. - Start metoprolol succinate 25 mg daily at bedtime. - Encouraged increased fluid and salt intake. - Recommended use of compression hose and consideration of abdominal binder. - Advised routine exercise, such as daily walking. - Provided POTS handout with information on hydration, salt intake, and compression therapy. - Will consider referral to [facility] clinic if symptoms persist or referral to PT. - Will establish care with cardiologist, Dr. Floretta.          Dispo:  Return in about 6 months (around 01/07/2025) for Routine Follow Up, w/ Glendia Ferrier, PA-C.  Signed, Glendia Ferrier, PA-C

## 2024-07-05 ENCOUNTER — Ambulatory Visit (HOSPITAL_COMMUNITY)
Admission: RE | Admit: 2024-07-05 | Discharge: 2024-07-05 | Disposition: A | Discharge status: Home / Self Care | Source: Ambulatory Visit | Attending: Cardiology | Admitting: Cardiology

## 2024-07-05 ENCOUNTER — Encounter: Payer: Self-pay | Admitting: *Deleted

## 2024-07-05 DIAGNOSIS — I493 Ventricular premature depolarization: Secondary | ICD-10-CM | POA: Insufficient documentation

## 2024-07-05 DIAGNOSIS — R079 Chest pain, unspecified: Secondary | ICD-10-CM | POA: Diagnosis not present

## 2024-07-05 DIAGNOSIS — R072 Precordial pain: Secondary | ICD-10-CM | POA: Diagnosis not present

## 2024-07-05 LAB — ECHOCARDIOGRAM COMPLETE
Area-P 1/2: 4.19 cm2
S' Lateral: 2.5 cm

## 2024-07-06 ENCOUNTER — Ambulatory Visit: Payer: Self-pay | Admitting: Physician Assistant

## 2024-07-06 DIAGNOSIS — R000 Tachycardia, unspecified: Secondary | ICD-10-CM | POA: Insufficient documentation

## 2024-07-06 DIAGNOSIS — I493 Ventricular premature depolarization: Secondary | ICD-10-CM

## 2024-07-08 NOTE — Assessment & Plan Note (Signed)
 Pt seen in 03/2024 for chest pain and palpitations. Exercise Echocardiogram was low risk. Echocardiogram demonstrated normal EF and no structural heart disease. Event monitor showed 1 episode of NSVT x 19 beats. ***

## 2024-07-09 ENCOUNTER — Ambulatory Visit: Attending: Physician Assistant | Admitting: Physician Assistant

## 2024-07-09 ENCOUNTER — Encounter: Payer: Self-pay | Admitting: Physician Assistant

## 2024-07-09 ENCOUNTER — Other Ambulatory Visit (HOSPITAL_COMMUNITY): Payer: Self-pay

## 2024-07-09 VITALS — BP 116/82 | HR 85 | Ht 63.0 in | Wt 233.0 lb

## 2024-07-09 DIAGNOSIS — R000 Tachycardia, unspecified: Secondary | ICD-10-CM | POA: Diagnosis not present

## 2024-07-09 DIAGNOSIS — R002 Palpitations: Secondary | ICD-10-CM

## 2024-07-09 MED ORDER — METOPROLOL SUCCINATE ER 25 MG PO TB24
25.0000 mg | ORAL_TABLET | Freq: Every day | ORAL | 3 refills | Status: AC
  Filled 2024-07-09: qty 90, 90d supply, fill #0

## 2024-07-09 NOTE — Patient Instructions (Signed)
 Medication Instructions:  START Metoprolol Succinate 25mg  Take 1 tablet once a day  *If you need a refill on your cardiac medications before your next appointment, please call your pharmacy*  Lab Work: None ordered If you have labs (blood work) drawn today and your tests are completely normal, you will receive your results only by: MyChart Message (if you have MyChart) OR A paper copy in the mail If you have any lab test that is abnormal or we need to change your treatment, we will call you to review the results.  Testing/Procedures: None ordered  Follow-Up: At [facility] you and your health needs are our priority.  As part of our continuing mission to provide you with exceptional heart care, our providers are all part of one team.  This team includes your primary Cardiologist (physician) and Advanced Practice Providers or APPs (Physician Assistants and Nurse Practitioners) who all work together to provide you with the care you need, when you need it.  Your next appointment:   6 month(s)  Provider:   Glendia Ferrier, PA-C      Then, Georganna Archer, MD will plan to see you again in 6 month(s).    We recommend signing up for the patient portal called MyChart.  Sign up information is provided on this After Visit Summary.  MyChart is used to connect with patients for Virtual Visits (Telemedicine).  Patients are able to view lab/test results, encounter notes, upcoming appointments, etc.  Non-urgent messages can be sent to your provider as well.   To learn more about what you can do with MyChart, go to [email or web address].   Other Instructions   Fast Heart Rate After Standing (Postural Orthostatic Tachycardia Syndrome): What to Know Postural orthostatic tachycardia syndrome (POTS) is a group of symptoms that happen when you stand up after lying down. It involves a fast heart rate. The symptoms get better when you lie back down. In some cases, POTS may be seen with other  health problems. In other cases, it may happen on its own. What are the causes? The cause of POTS isn't known. What increases the risk? You may be more at risk if: You're female and: 13-72 years old. About to have your period. Pregnant. You take certain medicines. You've had a major injury. You have certain health problems, such as: An infection from a germ called a virus. An autoimmune disease. This happens when your body's defense system (immune system) attacks your body. A loss of red blood cells over time. A gene problem that causes your joints to be too mobile, such as Ehlers-Danlos syndrome. A thyroid that makes too much of the thyroid hormone. Fibromyalgia. You've had surgery. You're often dehydrated. This means there's not enough water in your body. What are the signs or symptoms? The most common symptom is feeling light-headed when you stand up from lying down or sitting. Other symptoms may include: A fast heart rate within 10 minutes of standing up. Uneven heartbeats. Feeling like you may faint or fainting. Feeling short of breath. Chest pain. Feeling like you may throw up or throwing up. You may also: Feel tired. Have muscle pain or a headache. Have memory problems. Have trouble falling asleep or staying asleep. Your symptoms may be worse in the morning. How is this diagnosed? You may be diagnosed based on you and your family's health history. You may have an exam that includes: Checking your heart rate and blood pressure when you are: Lying down. Sitting. Standing. You may be  diagnosed based on your health history and your family's health history. You may have an exam that includes: Checking your heart rate and blood pressure when: Lying down. Sitting. Standing. Blood tests. Pee tests. Tests to check for health problems that are seen with POTS. How is this treated? Treatment depends on how bad your symptoms are and if you have other health problems. You  may need to: Treat other health problems. Drink two glasses of water before getting up after you lie down. Take in more salt (sodium). Take medicines to: Control your blood pressure. Slow your heart rate. Stop taking certain medicines. Start an exercise program. Wear lower body pressure (compression) devices. Follow these instructions at home: Medicines Take your medicines only as told. Talk to your health care provider about: Any vitamins, herbs, or supplements you take. Starting any new medicines. You may need to stop or adjust some medicines if they cause POTS. Eating and drinking Drink more fluids as told. Take in more salt as told. General instructions Exercise as told. Do an aerobic exercise for 30 minutes a day, at least 4 days a week. Aerobic exercises are those that cause your heart to beat faster. Do not smoke, vape, or use nicotine or tobacco. Keep all follow-up visits. Your treatment plan may change over time. Where to find more information Standing Up to POTS: [email or web address] Contact a health care provider if: Your symptoms don't get better after treatment. Your symptoms get worse. You have new symptoms. You have fast or uneven heartbeats. You faint. Get help right away if: You have chest pain. You have trouble breathing. These symptoms may be an emergency. Call 911 right away. Do not wait to see if the symptoms will go away. Do not drive yourself to the hospital. This information is not intended to replace advice given to you by your health care provider. Make sure you discuss any questions you have with your health care provider. Document Revised: 11/28/2023 Document Reviewed: 11/28/2023 Elsevier Patient Education  2025 [employer].

## 2024-07-11 ENCOUNTER — Ambulatory Visit (INDEPENDENT_AMBULATORY_CARE_PROVIDER_SITE_OTHER): Admitting: Licensed Clinical Social Worker

## 2024-07-11 DIAGNOSIS — F321 Major depressive disorder, single episode, moderate: Secondary | ICD-10-CM

## 2024-07-11 NOTE — Progress Notes (Signed)
 HERAPIST PROGRESS NOTE   Session Date: 07/11/2024  Session Time: 1515 - 1605  Participation Level: Active  Behavioral Response: CasualAlertEuthymic  Type of Therapy: Individual Therapy  Treatment Goals addressed:  LTG: Jhana [name] will recognize socially inappropriate behaviors and develop alternative behaviors (Social Interpersonal Effectiveness) STG: Harlea Goetzinger will demonstrate interest in social activities by initiating/joining social activity without prompts (Social Interpersonal Effectiveness)  ProgressTowards Goals: Progressing  Interventions: CBT, Motivational Interviewing, and Supportive  Summary: Ruth Escobar is a 23 y.o. Trans female with psych history of MDD, presenting for follow-up therapy session in efforts to improve management of presenting stressors.  Patient actively engaged in session, presenting in overall pleasant moods and congruent affect throughout duration of visit. Patient openly engaged in introductory check-in, sharing of It's been going, further detailing recent cardiology appt and having learned of heart being fine structurally, with possible dx of POTS and inappropriate sinus tachycardia, further detailing established tx regimen and continued monitoring over the next 6 months with referral to [facility] clinic if sxs persist. Further explored how pt's recent experiences over the past months have proven to support pt in shifting perspectives, processing how pt feels to have found self at a point of peace and acceptance in relation to many areas of stress within life, further noting factors specific to dating, being a trans-female but presenting as female unable to pass, and finding self being tired of having to repeatedly explain self, accepting the possibility for dating to be challenging. Pt additionally identified how shifts in perspectives and outlooks have supported in process of pursuing re-enrollment in college courses.  Suicidal/Homicidal: No; No  plan, no intent.  Therapist Response:  Clinician openly greeted patient upon presenting for today's visit, assessing presenting moods and affect, engaging pt in check-in, exploring daily events and presenting moods. Further engaged pt in utilizing open ended questions in evoking recounts of events, exploring newly encountered stressors, recurring challenges, observed implications on moods, and individual efforts at navigating stress.  Utilized active listening techniques to support pt's recounts, providing support and validation of identified feelings and perspectives in relation to recent challenges and stress. Utilized CBT, MI, psychoed, and supportive reflection techniques to aid pt in processing presenting challenges and means of navigating. Reassessed severity of sxs, and presence of any safety concerns.   [x]  Cognitive Challenging [x]  Cognitive Refocusing [x]  Cognitive Reframing  [x]  Communication Skills []  Compliance Issues []  DBT []  Exploration of Coping Patterns []  Exploration of Emotions [x]  Exploration of Relationship Patterns []  Guided Imagery []  Interactive Feedback [x]  Interpersonal Resolutions []  Mindfulness Training []  Preventative Services [x]  Psycho-Education []  Relaxation/Deep Breathing []  Review of Treatment Plan/Progress []  Role-Play/Behavioral Rehearsal [x]  Structured Problem Solving [x]  Supportive Reflection []  Symptom Management []  Other   Patient responded well to interventions. Patient continues to meet criteria for MDD. Patient will continue to benefit from engagement in outpatient therapy due to being the least restrictive service to meet presenting needs. Pt proves to maintain moderate progressions towards tx goals.  Plan: Return again in 2 weeks.  Diagnosis:  Encounter Diagnosis  Name Primary?   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  Patient/Guardian  was advised Release of Information must be obtained prior to any record release in order to collaborate their care with an outside provider. Patient/Guardian was advised if they have not already done so to contact the registration department to sign all necessary forms in order for  us  to release information regarding their care.   Consent: Patient/Guardian gives verbal consent for treatment and assignment of benefits for services provided during this visit. Patient/Guardian expressed understanding and agreed to proceed.   Lynwood JONETTA Maris, MSW, LCSW 07/11/24 4:06 PM

## 2024-07-17 ENCOUNTER — Ambulatory Visit (HOSPITAL_BASED_OUTPATIENT_CLINIC_OR_DEPARTMENT_OTHER): Attending: Internal Medicine | Admitting: Internal Medicine

## 2024-07-17 DIAGNOSIS — R0683 Snoring: Secondary | ICD-10-CM | POA: Insufficient documentation

## 2024-07-17 DIAGNOSIS — G4710 Hypersomnia, unspecified: Secondary | ICD-10-CM

## 2024-07-17 DIAGNOSIS — R5383 Other fatigue: Secondary | ICD-10-CM

## 2024-07-17 DIAGNOSIS — R0681 Apnea, not elsewhere classified: Secondary | ICD-10-CM

## 2024-07-17 LAB — GENECONNECT MOLECULAR SCREEN: Genetic Analysis Overall Interpretation: NEGATIVE

## 2024-07-18 ENCOUNTER — Other Ambulatory Visit (HOSPITAL_COMMUNITY): Payer: Self-pay

## 2024-07-22 NOTE — Procedures (Signed)
 [facility] [address] [place], [place] [address] Tel: [phone]   Fax: [phone]  Polysomnography Interpretation  Patient Name:  Gural, Aeryn Study Date:  07/17/2024 Referring Physician:  SUZEN LAMER [phone]) %%startinterp%% Indications for Polysomnography The patient is a 23 year-old Female who is 5' 3 and weighs 233.0 lbs. Her BMI equals 41.3.  A full polysomnogram was performed to evaluate for -.OSA  Medication  No Data.   Polysomnogram Data A full polysomnogram recorded the standard physiologic parameters including EEG, EOG, EMG, EKG, nasal and oral airflow.  Respiratory parameters of chest and abdominal movements were recorded with Respiratory Inductance Plethysmography belts.  Oxygen saturation was recorded by pulse oximetry.   Sleep Architecture The total recording time of the polysomnogram was 371.3 minutes.  The total sleep time was 308.5 minutes.  The patient spent 6.8% of total sleep time in Stage N1, 65.6% in Stage N2, 12.0% in Stages N3, and 15.6% in REM.  Sleep latency was 11.8 minutes.  REM latency was 140.0 minutes.  Sleep Efficiency was 83.1%.  Wake after Sleep Onset time was 50.5 minutes.  Respiratory Events The polysomnogram revealed a presence of - obstructive, - central, and - mixed apneas resulting in an Apnea index of - events per hour.  There were 1 hypopneas (>=3% desaturation and/or arousal) resulting in an Apnea\Hypopnea Index (AHI >=3% desaturation and/or arousal) of 0.2 events per hour.  There were 1 hypopneas (>=4% desaturation) resulting in an Apnea\Hypopnea Index (AHI >=4% desaturation) of 0.2 events per hour.  There were 14 Respiratory Effort Related Arousals resulting in a RERA index of 2.7 events per hour. The Respiratory Disturbance Index is 2.9 events per hour.  The snore index was - events per hour.  Mean oxygen saturation was 98.3%.  The lowest oxygen saturation during sleep was 93.0%.  Time  spent <=88% oxygen saturation was - minutes (-).  Limb Activity There were - total limb movements recorded, of this total, - were classified as PLMs.  PLM index was - per hour and PLM associated with Arousals index was - per hour.  Cardiac Summary The average pulse rate was 76.6 bpm.  The minimum pulse rate was 61.0 bpm while the maximum pulse rate was 112.0 bpm.  Cardiac rhythm was normal  Comments: Daytime study to accommodate patient's usual sleep schedule. Rare hypopneas, within normal limits, AHI(3%) 0.2/hr. Moderate to loud snoring with oxygen desaturation to a nadir of 93%, mean 98.3%.  Diagnosis: Primary snoring  Recommendations: Manage for symptoms. Consider ENT and/or Allergy evaluation for upper airway obstruction if appropriate and encourage normal body weight.   This study was personally reviewed and electronically signed by: Dr. Reggy JONETTA Salt Accredited Board Certified in Sleep Medicine Date/Time: 07/22/24   1:27    %%endinterp%%   Diagnostic PSG Report  Patient Name: Locurto, Alyissa Study Date: 07/17/2024  Date of Birth: 2001/01/05 Study Type: Split Night  Age: 60 year MRN #: [id number]  Sex: Female Interpreting Physician: SALT REGGY, [id number]  Height: 5' 3 Referring Physician: SUZEN SHELTON ([id number])  Weight: 233.0 lbs Recording Tech: Will Poet RRT RPSGT RST  BMI: 41.3 Scoring Tech: Will Poet RRT RPSGT RST  ESS: 10 Neck Size: 14.5   Study Overview  Lights Off: 08:25:13 AM  Count Index  Lights On: 02:36:32 PM Awakenings: 24 4.7  Time in Bed: 371.3 min. Arousals: 99 19.3  Total Sleep Time: 308.5 min. AHI (>=3% Desat and/or Ar.): 1 0.2   Sleep Efficiency: 83.1% AHI (>=  4% Desat): 1 0.2   Sleep Latency: 11.8 min. Limb Movements: - -  Wake After Sleep Onset: 50.5 min. Snore: - -  REM Latency from Sleep Onset: 140.0 min. Desaturations: 3 0.6     Minimum SpO2 TST: 93.0%    Sleep Architecture  % of Time in Bed Stages Time (mins) % Sleep Time   Wake 63.0   Stage N1 21.0 6.8%  Stage N2 202.5 65.6%  Stage N3 37.0 12.0%  REM 48.0 15.6%   Arousal Summary   NREM REM Sleep Index  Respiratory Arousals 12 1 13  2.5  PLM Arousals - - - -  Isolated Limb Movement Arousals - - - -  Snore Arousals - - - -  Spontaneous Arousals 82 4 86 16.7  Total 94 5 99 19.3   Limb Movement Summary   Count Index  Isolated Limb Movements - -  Periodic Limb Movements (PLMs) - -  Total Limb Movements - -    Respiratory Summary   By Sleep Stage By Body Position Total   NREM REM Supine Non-Supine   Time (min) 260.5 48.0 78.5 230.0 308.5         Obstructive Apnea - - - - -  Mixed Apnea - - - - -  Central Apnea - - - - -  Total Apneas - - - - -  Total Apnea Index - - - - -         Hypopneas (>=3% Desat and/or Ar.) - 1 - 1 1  AHI (>=3% Desat and/or Ar.) - 1.3 - 0.3 0.2         Hypopneas (>=4% Desat) - 1 - 1 1  AHI (>=4% Desat) - 1.3 - 0.3 0.2          RERAs 12 2 1 13 14   RERA Index 2.8 2.5 0.8 3.4 2.7         RDI 2.8 3.8 0.8 3.7 2.9    Respiratory Event Type Index  Central Apneas -  Obstructive Apneas -  Mixed Apneas -  Central Hypopneas -  Obstructive Hypopneas -  Central Apnea + Hypopnea (CAHI) -  Obstructive Apnea + Hypopnea (OAHI) 0.2   Respiratory Event Durations   Apnea Hypopnea   NREM REM NREM REM  Average (seconds) - - - 21.6  Maximum (seconds) - - - 21.6    Oxygen Saturation Summary   Wake NREM REM TST TIB  Average SpO2 (%) 98.6% 98.4% 97.9% 98.3% 98.3%  Minimum SpO2 (%) 97.0% 95.0% 93.0% 93.0% 93.0%  Maximum SpO2 (%) 100.0% 100.0% 100.0% 100.0% 100.0%   Oxygen Saturation Distribution  Range (%) Time in range (min) Time in range (%)  90.0 - 100.0 364.1 100.0%  80.0 - 90.0 - -  70.0 - 80.0 - -  60.0 - 70.0 - -  50.0 - 60.0 - -  0.0 - 50.0 - -  Time Spent <=88% SpO2  Range (%) Time in range (min) Time in range (%)  0.0 - 88.0 - -      Count Index  Desaturations 3 0.6    Cardiac Summary   Wake  NREM REM Sleep Total  Average Pulse Rate (BPM) 76.8 75.4 83.0 76.6 76.6  Minimum Pulse Rate (BPM) 61.0 61.0 70.0 61.0 61.0  Maximum Pulse Rate (BPM) 111.0 106.0 112.0 112.0 112.0   Pulse Rate Distribution:  Range (bpm) Time in range (min) Time in range (%)  0.0 - 40.0 - -  40.0 - 60.0 - -  60.0 - 80.0 281.9 77.4%  80.0 - 100.0 80.8 22.2%  100.0 - 120.0 1.6 0.4%  120.0 - 140.0 - -  140.0 - 200.0 - -      Hypnograms                      Technologist Comments  The patient is a 23yobf who is scheduled to undergo a Split Night study per provider's order. The [facility] the [facility] at 0700 in no noted distress.  The patient's procedure was performed in bedroom #3.  The patient's procedure was performed during the day to accommodate the patient's usual sleep routine.  The patient did not meet the split night criteria per standing protocol due to an inssufficient amount of respiratory events.  Note: obvious cardiac arrhythmias were noted. Note: Moderate to loud intermittent snoring was noted. Note: No PLMs/PLMAs were noted.  Bedtime medications: None reported taken by the patient. Bathroom visits. 1                            Emmarie Sannes [employer], Biomedical Engineer of Sleep Medicine  ELECTRONICALLY SIGNED ON:  07/22/2024, 1:21 PM [facility] PH: [phone]   FX: [phone] ACCREDITED BY THE AMERICAN ACADEMY OF SLEEP MEDICINE

## 2024-07-22 NOTE — Procedures (Signed)
  Indications for Polysomnography The patient is a 23 year-old Female who is 5' 3 and weighs 233.0 lbs. Her BMI equals 41.3.  A full night polysomnogram was performed to evaluate for -.  MedicationNo Data. Polysomnogram Data A full night polysomnogram recorded the standard physiologic parameters including EEG, EOG, EMG, EKG, nasal and oral airflow.  Respiratory parameters of chest and abdominal movements were recorded with Respiratory Inductance Plethysmography belts.   Oxygen saturation was recorded by pulse oximetry.  Sleep Architecture The total recording time of the polysomnogram was 371.3 minutes.  The total sleep time was 308.5 minutes.  The patient spent 6.8% of total sleep time in Stage N1, 65.6% in Stage N2, 12.0% in Stages N3, and 15.6% in REM.  Sleep latency was 11.8 minutes.   REM latency was 140.0 minutes.  Sleep Efficiency was 83.1%.  Wake after Sleep Onset time was 50.5 minutes.  Respiratory Events The polysomnogram revealed a presence of - obstructive, - central, and - mixed apneas resulting in an Apnea index of - events per hour.  There were 1 hypopneas (GreaterEqual to3% desaturation and/or arousal) resulting in an Apnea\Hypopnea Index (AHI  GreaterEqual to3% desaturation and/or arousal) of 0.2 events per hour.  There were 1 hypopneas (GreaterEqual to4% desaturation) resulting in an Apnea\Hypopnea Index (AHI GreaterEqual to4% desaturation) of 0.2 events per hour.  There were 14 Respiratory  Effort Related Arousals resulting in a RERA index of 2.7 events per hour. The Respiratory Disturbance Index is 2.9 events per hour.  The snore index was - events per hour.  Mean oxygen saturation was 98.3%.  The lowest oxygen saturation during sleep was 93.0%.  Time spent LessEqual to88% oxygen saturation was  minutes ().  Limb Activity There were - total limb movements recorded, of this total, - were classified as PLMs.  PLM index was - per hour and PLM associated with Arousals index was -  per hour.  Cardiac Summary The average pulse rate was 76.6 bpm.  The minimum pulse rate was 61.0 bpm while the maximum pulse rate was 112.0 bpm.  Cardiac rhythm was normal/abnormal.  Comments:  Diagnosis:  Recommendations:   This study was personally reviewed and electronically signed by: Dr. Reggy JONETTA Salt Accredited Board Certified in Sleep Medicine Date/Time:

## 2024-07-26 ENCOUNTER — Ambulatory Visit (INDEPENDENT_AMBULATORY_CARE_PROVIDER_SITE_OTHER): Admitting: Licensed Clinical Social Worker

## 2024-07-26 DIAGNOSIS — F321 Major depressive disorder, single episode, moderate: Secondary | ICD-10-CM

## 2024-07-26 NOTE — Progress Notes (Signed)
 HERAPIST PROGRESS NOTE   Session Date: 07/26/2024  Session Time: 1405 - 1500  Participation Level: Active  Behavioral Response: CasualAlertEuthymic  Type of Therapy: Individual Therapy  Treatment Goals addressed:  LTG: Ciani [name] will recognize socially inappropriate behaviors and develop alternative behaviors (Social Interpersonal Effectiveness) STG: Denyse Fillion will demonstrate interest in social activities by initiating/joining social activity without prompts (Social Interpersonal Effectiveness)  ProgressTowards Goals: Progressing  Interventions: CBT, Motivational Interviewing, and Supportive  Summary: [name] is a 23 y.o. Trans female with psych history of MDD, presenting for follow-up therapy session in efforts to improve management of presenting stressors.  Patient actively engaged in session, presenting in overall pleasant moods and congruent affect throughout duration of visit. Patient openly engaged in introductory check-in, sharing of things have been okay, further detailing of being tired due to getting up early to ensure dogs needs are met before taking to boarder, and running errands in preparation for traveling out of town for extended-cousins wedding on Saturday. Pt detailed of not feeling to have experienced any significant stress over recent weeks, as well as no added progressions in physical health challenges, having come to perspective of sxs potentially being related to GERD.   Suicidal/Homicidal: No; No plan, no intent.  Therapist Response:  Clinician openly greeted patient upon presenting for today's visit, assessing presenting moods and affect, engaging pt in check-in, exploring daily events and presenting moods. Further engaged pt in utilizing open ended questions in evoking recounts of recent events, exploring any presence of newly observed and/or identified stressors, recurring challenges in relation to physical health sxs, observed implications on  moods, and individual efforts at navigating stress.  Utilized active listening techniques to support pt's recounts, providing support and validation of identified feelings and perspectives. Utilized CBT, psychoed, and supportive reflection techniques to aid pt in processing presenting challenges and means of navigating. Reassessed severity of sxs, and presence of any safety concerns.   [x]  Cognitive Challenging [x]  Cognitive Refocusing [x]  Cognitive Reframing  [x]  Communication Skills []  Compliance Issues []  DBT []  Exploration of Coping Patterns []  Exploration of Emotions [x]  Exploration of Relationship Patterns []  Guided Imagery []  Interactive Feedback [x]  Interpersonal Resolutions []  Mindfulness Training []  Preventative Services [x]  Psycho-Education []  Relaxation/Deep Breathing []  Review of Treatment Plan/Progress []  Role-Play/Behavioral Rehearsal [x]  Structured Problem Solving [x]  Supportive Reflection []  Symptom Management []  Other   Patient responded well to interventions. Patient continues to meet criteria for MDD. Patient will continue to benefit from engagement in outpatient therapy due to being the least restrictive service to meet presenting needs. Pt proves to maintain moderate progressions towards tx goals.  Plan: Return again in 2 weeks.  Diagnosis:  Encounter Diagnosis  Name Primary?   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  Patient/Guardian was advised Release of Information must be obtained prior to any record release in order to collaborate their care with an outside provider. Patient/Guardian was advised if they have not already done so to contact the registration department to sign all necessary forms in order for us  to release information regarding their care.   Consent: Patient/Guardian gives verbal consent for treatment and assignment of benefits for services  provided during this visit. Patient/Guardian expressed understanding and agreed to proceed.   Virtual Visit via Video Note  I connected with Madina E Ohaver on 07/26/24 at  2:00 PM EST by a video enabled telemedicine application and verified that I  am speaking with the correct person using two identifiers.  Location: Patient: Home Provider: [facility] [place] Office   I discussed the limitations of evaluation and management by telemedicine and the availability of in person appointments. The patient expressed understanding and agreed to proceed.  I discussed the assessment and treatment plan with the patient. The patient was provided an opportunity to ask questions and all were answered. The patient agreed with the plan and demonstrated an understanding of the instructions.   The patient was advised to call back or seek an in-person evaluation if the symptoms worsen or if the condition fails to improve as anticipated.  I provided 55 minutes of non-face-to-face time during this encounter.   Lynwood JONETTA Maris, MSW, LCSW 07/26/24 2:06 PM

## 2024-08-07 ENCOUNTER — Ambulatory Visit (HOSPITAL_BASED_OUTPATIENT_CLINIC_OR_DEPARTMENT_OTHER): Admitting: Family

## 2024-08-07 ENCOUNTER — Ambulatory Visit (HOSPITAL_COMMUNITY): Admitting: Licensed Clinical Social Worker

## 2024-08-07 ENCOUNTER — Other Ambulatory Visit (HOSPITAL_COMMUNITY): Payer: Self-pay

## 2024-08-07 VITALS — BP 112/72 | HR 90 | Ht 63.0 in | Wt 238.4 lb

## 2024-08-07 DIAGNOSIS — F321 Major depressive disorder, single episode, moderate: Secondary | ICD-10-CM

## 2024-08-07 MED ORDER — BUPROPION HCL ER (XL) 150 MG PO TB24
150.0000 mg | ORAL_TABLET | ORAL | 1 refills | Status: DC
  Filled 2024-08-07 – 2024-10-09 (×2): qty 90, 90d supply, fill #0

## 2024-08-07 MED ORDER — BUPROPION HCL ER (XL) 300 MG PO TB24
300.0000 mg | ORAL_TABLET | ORAL | 1 refills | Status: DC
  Filled 2024-08-07: qty 90, 90d supply, fill #0

## 2024-08-07 NOTE — Progress Notes (Signed)
 [facility] MD/PA/NP OP Progress Note  08/07/2024 10:54 AM Ruth Escobar  MRN:  [id number]  Chief Complaint: Medication management follow-up  HPI: Ruth Escobar presents for medication management follow-up appointment.  Patient was seen and evaluated face-to-face by this provider.  Documented diagnoses related to major depressive disorder and generalized anxiety disorder.  Currently prescribed Wellbutrin  150 mg and hydroxyzine  10 mg as needed which he reports he has been taking and tolerating well.  Currently followed by cardiology due to PVCs and tachycardia.  Reports he was prescribed hypertension medication to help with symptoms.  Reports follow-up in 6 months with cardiology.  Ruth Escobar reports recently started testosterone injections denied that medication has affected mood.  Noting some symptoms related to hypersexual side effects.  No concerns related to suicidal or homicidal ideations.  Reports a good appetite.  States resting throughout the day.  As patient works night shift.  Reported he continues to make follow-up appointments with therapy services which has been helpful.  Therapist Lynwood Maris.  No concerns related to suicidal ideation.  Stated frustration with peers however, is able to utilize coping skills and deflect situations.  No concerns related to paranoia, delusional thought content or sleep disturbance during this assessment.  Follow-up 2 months for medication adherence/tolerability.   Visit Diagnosis:    ICD-10-CM   1. 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  Past Medical History:  Past Medical History:  Diagnosis Date   Asthma    Precordial chest pain 06/22/2024   Stress Echocardiogram 06/21/24: No ischemia    PVC's (premature ventricular contractions) 05/28/2024   Monitor  04/2024: NSR, NSVT x 1 (19 beats); no A-fib/SVT/high-grade heart block or pauses    No past surgical history on file.  Family Psychiatric History: Per previous documentation:unknown, patient reported that 'his' mother was taken medication for anxiety, however,  has recently discontinued medication and is not forthcoming with medical history.  Reports he has a older sibling.  Reports a pretty good relationship between the both of his siblings.  Family History:  Family History  Problem Relation Age of Onset   Cancer Mother    Cancer Father    Hypertension Father    Heart failure Paternal Grandmother 44 - 25   Heart attack Paternal Grandmother    Heart attack Paternal Uncle 22 - 49       died suddenly    Social History:  Social History   Socioeconomic History   Marital status: Single    Spouse name: Not on file   Number of children: Not on file   Years of education: Not on file   Highest education level: Not on file  Occupational History    Employer:     Industry: Other / Not Applicable    Comment: EMT at [employer]  Tobacco Use   Smoking status: Never   Smokeless tobacco: Never   Tobacco comments:    Herbal vape  Vaping Use   Vaping status: Never Used  Substance and Sexual Activity   Alcohol use: No   Drug use: No   Sexual activity: Not on file  Other Topics Concern   Not on file  Social History Narrative   Not on file   Social Drivers of Health   Financial Resource Strain: Medium Risk (06/13/2024)   Received from [facility]   Overall Financial Resource Strain (CARDIA)    Difficulty of Paying Living Expenses: Somewhat hard  Food Insecurity: Food Insecurity Present (06/13/2024)   Received from [facility]   Hunger Vital Sign    Within the past 12 months, you worried that your food would run out before you got the money to buy more.: Sometimes true    Within the past 12 months, the food you bought just didn't last and  you didn't have money to get more.: Never true  Transportation Needs: No Transportation Needs (06/13/2024)   Received from [facility] - Transportation    In the past 12 months, has lack of transportation kept you from medical appointments or from getting medications?: No    Lack of Transportation (Non-Medical): No  Physical Activity: Not on file  Stress: Not on file  Social Connections: Not on file    Allergies:  Allergies  Allergen Reactions   Bee Pollen     Other reaction(s): Cough   Citrus Rash   Pollen Extract Cough   Shellfish Allergy Anaphylaxis   Pimecrolimus Other (See Comments) and Rash    Other Reaction(s): Other (See Comments)  rash  Other reaction(s): Unknown  rash  Other Reaction(s): Unknown   Povidone-Iodine Rash    Metabolic Disorder Labs: No results found for: HGBA1C, MPG No results found for: PROLACTIN No results found for: CHOL, TRIG, HDL, CHOLHDL, VLDL, LDLCALC No results found for: TSH  Therapeutic Level Labs: No results found for: LITHIUM No results found for: VALPROATE No results found for: CBMZ  Current Medications: Current Outpatient Medications  Medication Sig Dispense Refill   albuterol  (PROAIR  HFA) 108 (90 Base) MCG/ACT inhaler Inhale 2 puffs into the lungs every 6 (six) hours as needed. 18 g 0   buPROPion  (WELLBUTRIN  XL) 150 MG 24 hr tablet Take 1 tablet (150 mg total) by mouth every morning. 90 tablet 1   cetirizine (ZYRTEC) 10 MG tablet Take 10 mg by mouth daily.     Cholecalciferol (VITAMIN D-1000 MAX ST) 25 MCG (1000 UT) tablet Take 5,000 Units by mouth daily.     EPINEPHrine 0.3 mg/0.3 mL IJ SOAJ injection Inject 0.3 mg into the muscle as needed for anaphylaxis.     hydrOXYzine  (ATARAX ) 10 MG tablet Take 1 tablet (10 mg total) by mouth at bedtime as needed and may repeat dose one time if needed. 30 tablet 0   metoprolol  succinate (TOPROL -XL) 25 MG 24 hr tablet Take 1 tablet (25 mg  total) by mouth daily. 90 tablet 3   montelukast  (SINGULAIR ) 10 MG tablet Take 1 tablet (10 mg total) by mouth daily. 90 tablet 3   Norethindrone Acetate-Ethinyl Estrad-FE (BLISOVI 24 FE) 1-20 MG-MCG(24) tablet Blisovi 24 Fe 1 mg-20 mcg (24)/75 mg (4) tablet  TAKE 1 TABLET BY MOUTH EVERY DAY     pantoprazole  (PROTONIX ) 20 MG tablet Take 1 tablet (20 mg total) by mouth daily. 28 tablet 0   sucralfate  (CARAFATE ) 1 g tablet Take 1 tablet (1 g total) by mouth 4 (four) times daily -  with meals and at bedtime. (Patient not taking: Reported on 07/09/2024) 30 tablet 0   testosterone cypionate (DEPOTESTOSTERONE CYPIONATE) 200 MG/ML injection Inject 100 mg into the muscle once a week.     No current facility-administered medications for this visit.     Musculoskeletal: Strength & Muscle Tone: within normal limits Gait & Station: normal Patient leans: N/A  Psychiatric Specialty Exam: Review of  Systems  Blood pressure 112/72, pulse 90, height 5' 3 (1.6 m), weight 238 lb 6.4 oz (108.1 kg).Body mass index is 42.23 kg/m.  General Appearance: Casual  Eye Contact:  Good  Speech:  Clear and Coherent  Volume:  Normal  Mood:  Anxious and Depressed  Affect:  Congruent  Thought Process:  Coherent  Orientation:  Full (Time, Place, and Person)  Thought Content: Logical   Suicidal Thoughts:  No  Homicidal Thoughts:  No  Memory:  Immediate;   Good Recent;   Good  Judgement:  Good  Insight:  Good  Psychomotor Activity:  Normal  Concentration:  Concentration: Good  Recall:  Good  Fund of Knowledge: Good  Language: Good  Akathisia:  No  Handed:  Right  AIMS (if indicated): not done  Assets:  Communication Skills Desire for Improvement  ADL's:  Intact  Cognition: WNL  Sleep:  Good   Screenings: GAD-7    Advertising Copywriter from 02/08/2024 in [facility] [place] Counselor from 10/31/2023 in [facility] [place] Counselor  from 10/17/2023 in [facility] [place] Counselor from 10/03/2023 in [facility] [place] Counselor from 09/19/2023 in [facility]  Total GAD-7 Score 2 1 1 3 2    PHQ2-9    Flowsheet Row Counselor from 02/08/2024 in [facility] [place] Counselor from 10/31/2023 in [facility] [place] Counselor from 10/17/2023 in [facility] [place] Counselor from 10/03/2023 in [facility] Counselor from 09/19/2023 in [facility] Total Score 0 0 0 1 0  PHQ-9 Total Score 2 2 1 2 3    Flowsheet Row ED from 06/18/2024 in [facility] UC from 03/12/2024 in [facility] at [facility] UC from 01/31/2024 in [facility] RISK CATEGORY No Risk No Risk No Risk     Assessment and Plan: Ruth  Lashica Escobar 23 year old patient prefers female pronouns.  Reports depression symptoms have been proved to include feelings of worthlessness, anxiety and depressive symptoms.  Notable difference reported when not on medication.   The meds does keeping me out of jail.  Reported his mood is stabilized since taking Wellbutrin  as directed.  No documented concerns related to suicidal or homicidal ideations.  Would like to have a little more focus throughout the night shift.  Consideration for tapering Wellbutrin  to 300 mg however patient to be cleared by cardiology prior to medication adjustment.  He was amendable to plan.  Support, encouragement and reassurance was provided.  Reported taking hydroxyzine  intermittently for sleep disturbance as sleep has improved.  Collaboration of Care: Collaboration of Care: Medication Management AEB continue Wellbutrin  150 mg  daily consideration adjustment after medical clearance by cardiology  Patient/Guardian was advised Release of Information must be obtained prior to any record release in order to collaborate their care with an outside provider. Patient/Guardian was advised if they have not already done so to contact the registration department to sign all necessary forms in order for us  to release information regarding their care.   Consent: Patient/Guardian gives verbal consent for treatment and assignment of benefits for services provided during this visit. Patient/Guardian expressed understanding and agreed to proceed.    Ruth Escobar LOISE Kerns, NP 08/07/2024, 10:54 AM

## 2024-08-08 DIAGNOSIS — Z713 Dietary counseling and surveillance: Secondary | ICD-10-CM | POA: Diagnosis not present

## 2024-08-08 DIAGNOSIS — E669 Obesity, unspecified: Secondary | ICD-10-CM | POA: Diagnosis not present

## 2024-08-10 ENCOUNTER — Ambulatory Visit (INDEPENDENT_AMBULATORY_CARE_PROVIDER_SITE_OTHER): Admitting: Licensed Clinical Social Worker

## 2024-08-10 DIAGNOSIS — F321 Major depressive disorder, single episode, moderate: Secondary | ICD-10-CM

## 2024-08-10 NOTE — Progress Notes (Unsigned)
 HERAPIST PROGRESS NOTE   Session Date: 08/10/2024  Session Time: 1112 - 1209  Participation Level: Active  Behavioral Response: CasualAlertEuthymic  Type of Therapy: Individual Therapy  Treatment Goals addressed:  LTG: Samreet [name] will recognize socially inappropriate behaviors and develop alternative behaviors (Social Interpersonal Effectiveness) STG: Elvy Mclarty will demonstrate interest in social activities by initiating/joining social activity without prompts (Social Interpersonal Effectiveness)  ProgressTowards Goals: Progressing  Interventions: CBT, Motivational Interviewing, and Supportive  Summary: [name] is a 23 y.o. Trans female with psych history of MDD, presenting for follow-up therapy session in efforts to improve management of presenting stressors.  Patient actively engaged in session, presenting in overall pleasant moods and congruent affect throughout duration of visit. Patient openly engaged in introductory check-in, sharing of things have been okay, further detailing of having only recently gotten up due to having worked last night and having gone to sleep after getting home this morning.  Pt actively shared recounts of events of the past two weeks since previous visit, detailing of family wedding in [place] having proven enjoyable and having appreciated time spent with family members, identifying gratitude to have opportunity to spend time with family surrounding joyful events versus recent increase in time spent with father's side of the family due to family members being in poor health or for funerals.  Pt shared of recent med man visit, detailing of having identified increased concerns as of late surrounding challenges with focus, having explored potential benefits in increasing Wellbutrin  dosage for added support, determining need to postpone such adjustments due to PVCs and tachycardia currently being monitored by cardiology.   Pt additionally shared of  recent experience of palpitations resulting in EMS presenting to pt's home, declining further medical attention upon EMS arrival and maintained stability.  Pt noted of experiencing increased stress in relation to workplace interactions with colleague with whom pt does not work effectively with, proving to increase irritability, due to believing colleagues impact on adding to work related stressors, processing factors that are within and outside of ones sphere of control and sphere of influence, determining ways in which pt may prove to navigate challenges in the future.  Suicidal/Homicidal: No; No plan, no intent.  Therapist Response:  Clinician openly greeted patient upon presenting for today's visit, assessing presenting moods and affect, engaging in introductory check-in, and exploring presenting moods. Further engaged pt in utilizing open ended questions in evoking recounts of recent events, exploring experiences with family at recent family wedding, recent experienced palpitations, and recent observations of newly identified stress.  Utilized active listening techniques to support pt's recounts, providing support and validation of identified feelings and perspectives. Utilized CBT, psychoed, and supportive reflection techniques to aid pt in processing presenting challenges and means of navigating. Reassessed severity of sxs, and presence of any safety concerns.   [x]  Cognitive Challenging [x]  Cognitive Refocusing [x]  Cognitive Reframing  [x]  Communication Skills []  Compliance Issues []  DBT []  Exploration of Coping Patterns []  Exploration of Emotions [x]  Exploration of Relationship Patterns []  Guided Imagery []  Interactive Feedback [x]  Interpersonal Resolutions []  Mindfulness Training []  Preventative Services [x]  Psycho-Education []  Relaxation/Deep Breathing []  Review of Treatment Plan/Progress []  Role-Play/Behavioral Rehearsal [x]  Structured Problem Solving [x]  Supportive Reflection []   Symptom Management []  Other   Patient responded well to interventions. Patient continues to meet criteria for MDD. Patient will continue to benefit from engagement in outpatient therapy due to being the least restrictive service to meet presenting needs. Pt proves to maintain moderate progressions towards tx goals.  Plan:  Return again in 2 weeks.  Diagnosis:  Encounter Diagnosis  Name Primary?   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  Patient/Guardian was advised Release of Information must be obtained prior to any record release in order to collaborate their care with an outside provider. Patient/Guardian was advised if they have not already done so to contact the registration department to sign all necessary forms in order for us  to release information regarding their care.   Consent: Patient/Guardian gives verbal consent for treatment and assignment of benefits for services provided during this visit. Patient/Guardian expressed understanding and agreed to proceed.   Virtual Visit via Video Note  I connected with Tinita E Roll on 08/10/24 at 11:00 AM EST by a video enabled telemedicine application and verified that I am speaking with the correct person using two identifiers.  Location: Patient: Home Provider: Home Office   I discussed the limitations of evaluation and management by telemedicine and the availability of in person appointments. The patient expressed understanding and agreed to proceed.  I discussed the assessment and treatment plan with the patient. The patient was provided an opportunity to ask questions and all were answered. The patient agreed with the plan and demonstrated an understanding of the instructions.   The patient was advised to call back or seek an in-person evaluation if the symptoms worsen or if the condition fails to improve as  anticipated.  I provided 57 minutes of non-face-to-face time during this encounter.   Lynwood JONETTA Maris, MSW, LCSW 08/10/24 11:32 AM

## 2024-08-17 DIAGNOSIS — Z713 Dietary counseling and surveillance: Secondary | ICD-10-CM | POA: Diagnosis not present

## 2024-08-17 DIAGNOSIS — E669 Obesity, unspecified: Secondary | ICD-10-CM | POA: Diagnosis not present

## 2024-08-22 ENCOUNTER — Encounter (HOSPITAL_COMMUNITY): Payer: Self-pay

## 2024-08-22 ENCOUNTER — Ambulatory Visit (INDEPENDENT_AMBULATORY_CARE_PROVIDER_SITE_OTHER): Admitting: Licensed Clinical Social Worker

## 2024-08-22 ENCOUNTER — Other Ambulatory Visit (HOSPITAL_COMMUNITY): Payer: Self-pay

## 2024-08-22 DIAGNOSIS — F321 Major depressive disorder, single episode, moderate: Secondary | ICD-10-CM | POA: Diagnosis not present

## 2024-08-22 MED ORDER — "TUBERCULIN SYRINGE 25G X 5/8"" 1 ML MISC"
1 refills | Status: AC
  Filled 2024-09-16: qty 4, 28d supply, fill #0
  Filled 2024-10-09: qty 12, 84d supply, fill #0

## 2024-08-22 MED ORDER — PANTOPRAZOLE SODIUM 40 MG PO TBEC
40.0000 mg | DELAYED_RELEASE_TABLET | Freq: Every day | ORAL | 1 refills | Status: AC
  Filled 2024-08-22: qty 90, 90d supply, fill #0

## 2024-08-22 MED ORDER — SEMAGLUTIDE-WEIGHT MANAGEMENT 0.25 MG/0.5ML ~~LOC~~ SOAJ
0.2500 mg | SUBCUTANEOUS | 1 refills | Status: AC
  Filled 2024-08-22: qty 2, 28d supply, fill #0

## 2024-08-22 MED ORDER — HAILEY 24 FE 1-20 MG-MCG(24) PO TABS
1.0000 | ORAL_TABLET | Freq: Every day | ORAL | 3 refills | Status: AC
  Filled 2024-08-22: qty 84, 84d supply, fill #0

## 2024-08-22 MED ORDER — SUCRALFATE 1 G PO TABS
1.0000 g | ORAL_TABLET | Freq: Four times a day (QID) | ORAL | 1 refills | Status: AC
  Filled 2024-08-22: qty 120, 30d supply, fill #0

## 2024-08-22 MED ORDER — NEEDLE (DISP) 18G X 1" MISC: 1 refills | Status: AC

## 2024-08-22 NOTE — Progress Notes (Unsigned)
 HERAPIST PROGRESS NOTE   Session Date: 08/22/2024  Session Time: 1115 - 1225  Participation Level: Active  Behavioral Response: CasualAlertDepressed  Type of Therapy: Individual Therapy  Treatment Goals addressed:   Progressing (2) LTG: Aasha [name] will recognize socially inappropriate behaviors and develop alternative behaviors (Social Interpersonal Effectiveness) STG: Alyssandra Hulsebus will demonstrate interest in social activities by initiating/joining social activity without prompts (Social Interpersonal Effectiveness)  Initial (3) STG: Reduce overall depression score by a minimum of 25% on the Patient Health Questionnaire (PHQ-9) (OP Depression) STG: Layal [name] will identify cognitive patterns and beliefs that support depression (OP Depression) LTG: Try not to compare ourselves to others as it relates to life and where we are in our journey (OP Depression)  Completed/Met (4) LTG: Reduce frequency, intensity, and duration of depression symptoms so that daily functioning is improved (OP Depression) LTG: Increase coping skills to manage depression and improve ability to perform daily activities (OP Depression) STG: Keiaira [name] will reduce frequency of avoidant behaviors by 50% as evidenced by self-report in therapy sessions (OP Depression) LTG: I just want to understand my emotions and triggers for depression better (OP Depression)  ProgressTowards Goals: Progressing  Interventions: CBT, Motivational Interviewing, and Supportive  Summary: [name] is a 23 y.o. Trans female with psych history of MDD, presenting for follow-up therapy session in efforts to improve management of presenting stressors.  Patient was actively engaged throughout the session, presenting with an overall depressed mood and congruent affect. During check-in, patient reported a noticeable increase in depressive symptoms over the past several weeks since the previous visit and was unable to identify  clear triggers or contributing events. Patient noted spending significantly more time sleeping and isolating over the past two weeks, describing decreased motivation and energy.  Administered and reviewed the PHQ-9, discussing current symptom severity and comparing scores to both recent assessments and to the initial screening conducted at the start of treatment last year. Patient reflected that current symptoms feel similar in nature but not as severe as those experienced at intake. Explored potential contributing factors, including patient's sense of not being where he intended personally or professionally, and experiencing negative or self-critical thoughts about limited perceived progress over the past year.  Processed barriers that have impacted patient's goals, including significant medical complications related to GI and cardiology concerns, which delayed his pursuit of [employer] by more than six months, ongoing medical symptoms requiring continued monitoring, multiple family losses, and additional life stressors that have affected functioning and emotional stability. Explored distinctions between this year's challenges and those of the prior year, integrating cognitive reframing to shift perspective on setbacks, adjust expectations, and contextualize symptoms within ongoing stress and recovery processes.  Reviewed individualized treatment plan goals in detail, identifying areas of progress, goals successfully completed or modified, and remaining concerns requiring ongoing therapeutic focus. Patient demonstrated insight into both external barriers and internal patterns impacting goal progression.     08/22/2024   11:23 AM 02/08/2024    2:43 PM 10/31/2023    3:23 PM 10/17/2023    3:13 PM 10/03/2023    3:14 PM  Depression screen PHQ 2/9  Decreased Interest 1 0 0 0 1  Down, Depressed, Hopeless 2 0 0 0 0  PHQ - 2 Score 3 0 0 0 1  Altered sleeping 2 1 1  0 0  Tired, decreased energy 2 1 0 0 0   Change in appetite 1 0 0 1 0  Feeling bad or failure about yourself  1 0 0  0 1  Trouble concentrating 2 0 0 0 0  Moving slowly or fidgety/restless 1 0 1 0 0  Suicidal thoughts 1 0 0 0 0  PHQ-9 Score 13 2  2  1  2    Difficult doing work/chores Somewhat difficult Somewhat difficult Not difficult at all Not difficult at all Not difficult at all     Data saved with a previous flowsheet row definition    Suicidal/Homicidal: No; No plan, no intent.  Therapist Response:  Clinician openly greeted patient upon presenting for visit, assessing presenting moods and affect, engaging in introductory check-in. Further engaged pt in utilizing open ended questions in evoking recounts of recent events and stressors. Utilized active listening techniques to support pt's recounts. Therapist utilized supportive reflection, CBT, MI, psychoeducation, and solution-focused strategies to address increased depressive symptoms, reframe negative thoughts, process barriers to goal progression, and identify actionable coping steps. Reassessed severity of sxs, and presence of any safety concerns.   [x]  Cognitive Challenging [x]  Cognitive Refocusing [x]  Cognitive Reframing  [x]  Communication Skills []  Compliance Issues []  DBT []  Exploration of Coping Patterns [x]  Exploration of Emotions [x]  Exploration of Relationship Patterns []  Guided Imagery []  Interactive Feedback [x]  Interpersonal Resolutions []  Mindfulness Training []  Preventative Services [x]  Psycho-Education []  Relaxation/Deep Breathing [x]  Review of Treatment Plan/Progress []  Role-Play/Behavioral Rehearsal [x]  Structured Problem Solving [x]  Supportive Reflection []  Symptom Management []  Other   Patient responded well to interventions. Patient continues to meet criteria for MDD. Patient will continue to benefit from engagement in outpatient therapy due to being the least restrictive service to meet presenting needs. Pt proves to maintain moderate progressions  towards tx goals.  Plan: Return again in 2 weeks.  Diagnosis:  Encounter Diagnosis  Name Primary?   Current moderate episode of major depressive disorder without prior episode (HCC) Yes     Collaboration of Care: Other none necessary at this time.  Patient/Guardian was advised Release of Information must be obtained prior to any record release in order to collaborate their care with an outside provider. Patient/Guardian was advised if they have not already done so to contact the registration department to sign all necessary forms in order for us  to release information regarding their care.   Consent: Patient/Guardian gives verbal consent for treatment and assignment of benefits for services provided during this visit. Patient/Guardian expressed understanding and agreed to proceed.     Lynwood JONETTA Maris, MSW, LCSW 08/22/24 11:30 AM

## 2024-08-23 ENCOUNTER — Other Ambulatory Visit (HOSPITAL_COMMUNITY): Payer: Self-pay

## 2024-08-23 ENCOUNTER — Other Ambulatory Visit: Payer: Self-pay

## 2024-08-23 MED ORDER — EPINEPHRINE 0.3 MG/0.3ML IJ SOAJ
0.3000 mg | INTRAMUSCULAR | 3 refills | Status: AC
  Filled 2024-08-23: qty 2, 30d supply, fill #0

## 2024-08-30 ENCOUNTER — Other Ambulatory Visit (HOSPITAL_COMMUNITY): Payer: Self-pay

## 2024-08-30 MED ORDER — AZITHROMYCIN 250 MG PO TABS
ORAL_TABLET | ORAL | 0 refills | Status: AC
  Filled 2024-08-30: qty 6, 5d supply, fill #0

## 2024-08-30 MED ORDER — HYDROCODONE BIT-HOMATROP MBR 5-1.5 MG/5ML PO SOLN
5.0000 mL | ORAL | 0 refills | Status: AC | PRN
  Filled 2024-08-30: qty 180, 6d supply, fill #0

## 2024-08-30 MED ORDER — METHYLPREDNISOLONE 4 MG PO TBPK
ORAL_TABLET | ORAL | 0 refills | Status: AC
  Filled 2024-08-30: qty 21, 6d supply, fill #0

## 2024-09-01 ENCOUNTER — Encounter (HOSPITAL_COMMUNITY): Payer: Self-pay

## 2024-09-01 ENCOUNTER — Ambulatory Visit (HOSPITAL_COMMUNITY)
Admission: EM | Admit: 2024-09-01 | Discharge: 2024-09-01 | Disposition: A | Discharge status: Home / Self Care | Attending: Nurse Practitioner | Admitting: Nurse Practitioner

## 2024-09-01 DIAGNOSIS — J4531 Mild persistent asthma with (acute) exacerbation: Secondary | ICD-10-CM

## 2024-09-01 DIAGNOSIS — J069 Acute upper respiratory infection, unspecified: Secondary | ICD-10-CM | POA: Diagnosis not present

## 2024-09-01 MED ORDER — IPRATROPIUM-ALBUTEROL 0.5-2.5 (3) MG/3ML IN SOLN
RESPIRATORY_TRACT | Status: AC
  Filled 2024-09-01: qty 3

## 2024-09-01 MED ORDER — CETIRIZINE-PSEUDOEPHEDRINE ER 5-120 MG PO TB12: 1.0000 | ORAL_TABLET | Freq: Every day | ORAL | 0 refills | Status: AC

## 2024-09-01 MED ORDER — MUCINEX DM MAXIMUM STRENGTH 60-1200 MG PO TB12: 1.0000 | ORAL_TABLET | Freq: Two times a day (BID) | ORAL | 0 refills | Status: AC

## 2024-09-01 MED ORDER — IPRATROPIUM-ALBUTEROL 0.5-2.5 (3) MG/3ML IN SOLN
3.0000 mL | Freq: Once | RESPIRATORY_TRACT | Status: AC
  Administered 2024-09-01: 3 mL via RESPIRATORY_TRACT

## 2024-09-01 NOTE — ED Triage Notes (Signed)
 Patient reports that she began having a cough and fever 4 days ago. Fever ended 2 days ago. Patient reports that she has asthma and her albuterol  inhaler is not helping.  Patient did a telephone visit with her PCP yesterday.  Patient reports she did an at home Covid/Flu and it ws negative 2 days ago.  Patient has been using albuterol  and PCP prescribed Prednisone yesterday.

## 2024-09-01 NOTE — ED Provider Notes (Signed)
 [facility]    CSN: [id number] Arrival date & time: 09/01/24  0940      History   Chief Complaint Chief Complaint  Patient presents with   Cough   Fever   asthma    HPI Ruth Escobar is a 23 y.o. adult.   Discussed the use of AI scribe software for clinical note transcription with the patient, who gave verbal consent to proceed.   The patient with a history of asthma presents with a persistent cough and recent fever that began four days ago. The fever resolved two days ago; however, the cough has continued and is associated with an asthma flare that is not responding to the patient's usual inhaler. The patient had a telephone visit with their physician yesterday and was prescribed prednisone, azithromycin , and Hycodan cough syrup.  The patient reports ongoing chest tightness, shortness of breath, and wheezing in addition to the persistent cough. The cough is worse when lying down and has led to nausea and significant disruption of sleep. The patient suspects postnasal drainage may be contributing to the cough when lying flat. The patient performed home COVID-19 and influenza testing two days ago, both of which were negative.  The following sections of the patient's history were reviewed and updated as appropriate: allergies, current medications, past family history, past medical history, past social history, past surgical history, and problem list.      Past Medical History:  Diagnosis Date   Asthma    Precordial chest pain 06/22/2024   Stress Echocardiogram 06/21/24: No ischemia    PVC's (premature ventricular contractions) 05/28/2024   Monitor 04/2024: NSR, NSVT x 1 (19 beats); no A-fib/SVT/high-grade heart block or pauses     Patient Active Problem List   Diagnosis Date Noted   Sinus tachycardia 07/06/2024   Precordial chest pain 06/22/2024   PVC's (premature ventricular contractions) 05/28/2024   Depression 02/06/2024   Adjustment disorder with  mixed anxiety and depressed mood 08/16/2023    History reviewed. No pertinent surgical history.  OB History   No obstetric history on file.      Home Medications    Prior to Admission medications  Medication Sig Start Date End Date Taking? Authorizing Provider  cetirizine -pseudoephedrine  (ZYRTEC -D) 5-120 MG tablet Take 1 tablet by mouth daily with breakfast for 10 days. 09/01/24 09/11/24 Yes Iola Lukes, FNP  Dextromethorphan-guaiFENesin  (MUCINEX  DM MAXIMUM STRENGTH) 60-1200 MG TB12 Take 1 tablet by mouth 2 (two) times daily. 09/01/24  Yes Iola Lukes, FNP  albuterol  (PROAIR  HFA) 108 (90 Base) MCG/ACT inhaler Inhale 2 puffs into the lungs every 6 (six) hours as needed. 03/04/23   Raspet, Erin K, PA-C  azithromycin  (ZITHROMAX  Z-PAK) 250 MG tablet Take 2 tablets (500 mg total) by mouth daily for 1 day, THEN 1 tablet (250 mg total) daily for 4 days. 08/30/24 09/04/24    buPROPion  (WELLBUTRIN  XL) 150 MG 24 hr tablet Take 1 tablet (150 mg total) by mouth every morning. 08/07/24 08/07/25  Ezzard Staci SAILOR, NP  Cholecalciferol (VITAMIN D-1000 MAX ST) 25 MCG (1000 UT) tablet Take 5,000 Units by mouth daily.    [provider]  EPINEPHrine  0.3 mg/0.3 mL IJ SOAJ injection Inject 0.3 mg into the muscle as needed for anaphylaxis.    [provider]  EPINEPHrine  0.3 mg/0.3 mL IJ SOAJ injection Inject 0.3mg  as directed for life threatening allergic reaction. 08/22/24     HYDROcodone  bit-homatropine (HYCODAN) 5-1.5 MG/5ML syrup Take 5 mLs by mouth every 4 (four) hours as  needed. 08/30/24     hydrOXYzine  (ATARAX ) 10 MG tablet Take 1 tablet (10 mg total) by mouth at bedtime as needed and may repeat dose one time if needed. 05/08/24   Ezzard Staci SAILOR, NP  methylPREDNISolone  (MEDROL ) 4 MG TBPK tablet Take 6 tablets (24 mg total) by mouth daily for 1 day, THEN 5 tablets (20 mg total) daily for 1 day, THEN 4 tablets (16 mg total) daily for 1 day, THEN 3 tablets (12 mg total) daily for 1  day, THEN 2 tablets (8 mg total) daily for 1 day, THEN 1 tablet (4 mg total) daily for 1 day. 08/30/24 09/05/24    metoprolol  succinate (TOPROL -XL) 25 MG 24 hr tablet Take 1 tablet (25 mg total) by mouth daily. 07/09/24   Lelon Hamilton T, PA-C  NEEDLE, DISP, 18 G 18G X 1 MISC Use as directed to draw up testosterone weekly. 06/13/24     Norethindrone  Acetate-Ethinyl Estrad-FE (BLISOVI  24 FE) 1-20 MG-MCG(24) tablet Blisovi  24 Fe 1 mg-20 mcg (24)/75 mg (4) tablet  TAKE 1 TABLET BY MOUTH EVERY DAY    [provider]  Norethindrone  Acetate-Ethinyl Estrad-FE (HAILEY  24 FE) 1-20 MG-MCG(24) tablet Take 1 tablet by mouth daily. 02/01/24     pantoprazole  (PROTONIX ) 20 MG tablet Take 1 tablet (20 mg total) by mouth daily. 01/31/24   Daralene Lonni BIRCH, PA-C  pantoprazole  (PROTONIX ) 40 MG tablet Take 1 tablet (40 mg total) by mouth daily. 04/04/24     semaglutide -weight management (WEGOVY ) 0.25 MG/0.5ML SOAJ SQ injection Inject 0.25 mg into the skin once a week. Patient not taking: Reported on 09/01/2024 04/17/24     sucralfate  (CARAFATE ) 1 g tablet Take 1 tablet (1 g total) by mouth 4 (four) times daily -  with meals and at bedtime. Patient not taking: Reported on 07/09/2024 01/31/24   Daralene Lonni D, PA-C  sucralfate  (CARAFATE ) 1 g tablet Take 1 tablet (1 g total) by mouth 4 (four) times daily on an empty stomach. 02/21/24     testosterone cypionate (DEPOTESTOSTERONE CYPIONATE) 200 MG/ML injection Inject 100 mg into the muscle once a week.    [provider]  TUBERCULIN SYR 1CC/25GX5/8 25G X 5/8 1 ML MISC Use 1 syringe once a week for subcutaneous testosterone injection. 06/25/24       Family History Family History  Problem Relation Age of Onset   Cancer Mother    Cancer Father    Hypertension Father    Heart failure Paternal Grandmother 3 - 65   Heart attack Paternal Grandmother    Heart attack Paternal Uncle 50 - 36       died suddenly    Social History Social  History[1]   Allergies   Bee pollen, Citrus, Pollen extract, Shellfish allergy, Pimecrolimus, and Povidone-iodine   Review of Systems Review of Systems  Constitutional:  Negative for fever.  HENT:  Positive for nosebleeds and postnasal drip.   Respiratory:  Positive for cough, chest tightness, shortness of breath and wheezing.   Gastrointestinal:  Positive for nausea.  Psychiatric/Behavioral:  Positive for sleep disturbance.   All other systems reviewed and are negative.    Physical Exam Triage Vital Signs ED Triage Vitals [09/01/24 1031]  Encounter Vitals Group     BP 114/77     Girls Systolic BP Percentile      Girls Diastolic BP Percentile      Boys Systolic BP Percentile      Boys Diastolic BP Percentile      Pulse  Rate 86     Resp 16     Temp (!) 97.5 F (36.4 C)     Temp Source Oral     SpO2 97 %     Weight      Height      Head Circumference      Peak Flow      Pain Score 0     Pain Loc      Pain Education      Exclude from Growth Chart    No data found.  Updated Vital Signs BP 114/77 (BP Location: Left Arm)   Pulse 86   Temp (!) 97.5 F (36.4 C) (Oral)   Resp 16   SpO2 97%   Visual Acuity Right Eye Distance:   Left Eye Distance:   Bilateral Distance:    Right Eye Near:   Left Eye Near:    Bilateral Near:     Physical Exam Vitals reviewed.  Constitutional:      General: He is awake. He is not in acute distress.    Appearance: Normal appearance. He is well-developed. He is not ill-appearing, toxic-appearing or diaphoretic.  HENT:     Head: Normocephalic.     Right Ear: Hearing, tympanic membrane, ear canal and external ear normal. No drainage, swelling or tenderness. No middle ear effusion. Tympanic membrane is not erythematous.     Left Ear: Hearing, tympanic membrane, ear canal and external ear normal. No drainage, swelling or tenderness.  No middle ear effusion. Tympanic membrane is not erythematous.     Nose: Nose normal.      Mouth/Throat:     Lips: Pink.     Mouth: Mucous membranes are moist.     Pharynx: Oropharynx is clear. Uvula midline. No pharyngeal swelling, oropharyngeal exudate, posterior oropharyngeal erythema or uvula swelling.     Tonsils: No tonsillar exudate or tonsillar abscesses.  Eyes:     General: Vision grossly intact.     Conjunctiva/sclera: Conjunctivae normal.  Cardiovascular:     Rate and Rhythm: Normal rate and regular rhythm.     Heart sounds: Normal heart sounds.  Pulmonary:     Effort: Pulmonary effort is normal. No tachypnea or respiratory distress.     Breath sounds: Normal breath sounds and air entry. No wheezing or rhonchi.     Comments: Respirations even and unlabored  Musculoskeletal:        General: Normal range of motion.     Cervical back: Normal range of motion and neck supple.  Lymphadenopathy:     Cervical: No cervical adenopathy.  Skin:    General: Skin is warm and dry.  Neurological:     General: No focal deficit present.     Mental Status: He is alert and oriented to person, place, and time.  Psychiatric:        Behavior: Behavior is cooperative.      UC Treatments / Results  Labs (all labs ordered are listed, but only abnormal results are displayed) Labs Reviewed - No data to display  EKG   Radiology No results found.  Procedures Procedures (including critical care time)  Medications Ordered in UC Medications  ipratropium-albuterol  (DUONEB) 0.5-2.5 (3) MG/3ML nebulizer solution 3 mL (has no administration in time range)    Initial Impression / Assessment and Plan / UC Course  I have reviewed the triage vital signs and the nursing notes.  Pertinent labs & imaging results that were available during my care of the patient were reviewed  by me and considered in my medical decision making (see chart for details).     The patient with a history of asthma presents with a persistent cough following a viral upper respiratory infection, accompanied  by chest tightness, shortness of breath, and wheezing consistent with an asthma exacerbation. Fever was present initially but resolved two days ago. The cough has persisted despite use of the patients usual inhaler and is worse when lying down, causing sleep disruption and nausea, likely related to postnasal drainage. Home COVID-19 and influenza testing were negative. The patient was recently started on prednisone, azithromycin , and Hycodan following a telephone visit and has continued symptoms prompting in-person evaluation.  A DuoNeb treatment was administered in clinic. The patient was advised to continue albuterol  as needed, with instruction to rinse the mouth after inhaler use, and to complete the prescribed courses of prednisone and azithromycin . Given symptoms suggestive of postnasal drainage contributing to nocturnal cough, Mucinex  twice daily and cetirizine  with pseudoephedrine  once daily were added. The patient was counseled on adjusting medication timing to accommodate night shift work.  Medication interactions were discussed due to patient concerns regarding Wellbutrin  and QT prolongation. Review of prior EKG from September 29 showed a normal QT interval, and repeat EKG was not indicated at this time. The patient was advised that medications such as albuterol , steroids, and pseudoephedrine  may cause transient increases in heart rate or palpitations, which are expected if brief and self-resolving. Follow-up with the primary care provider is recommended if symptoms do not improve or if asthma control remains suboptimal. Emergency evaluation is advised for worsening shortness of breath, chest pain, inability to speak in full sentences, persistent tachycardia, syncope, or any new or concerning symptoms.  Today's evaluation has revealed no signs of a dangerous process. Discussed diagnosis with patient and/or guardian. Patient and/or guardian aware of their diagnosis, possible red flag symptoms to watch  out for and need for close follow up. Patient and/or guardian understands verbal and written discharge instructions. Patient and/or guardian comfortable with plan and disposition.  Patient and/or guardian has a clear mental status at this time, good insight into illness (after discussion and teaching) and has clear judgment to make decisions regarding their care  Documentation was completed with the aid of voice recognition software. Transcription may contain typographical errors.  Final Clinical Impressions(s) / UC Diagnoses   Final diagnoses:  Mild persistent asthma with acute exacerbation in adult  Upper respiratory tract infection, unspecified type     Discharge Instructions      Your symptoms are most likely due to a viral respiratory infection that has triggered an asthma flare. Continue taking the medications prescribed by your primary care provider yesterday. A decongestant has been added to help with post-nasal drainage, along with prescription Mucinex  to thin mucus so it can clear more easily. These medications will not cure the virus but should help you feel better as your body recovers. Because you work night shifts, take the antibiotic, Mucinex , and Hycodan before going to sleep in the morning, and take the steroid, decongestant, and second Mucinex  dose before going to work. Use the albuterol  inhaler as needed, and rinse your mouth after inhaler use to prevent oral yeast infection. Drink plenty of fluids to stay hydrated and help thin mucus, aiming for pale yellow urine. Using a cool-mist humidifier, inhaling steam, sleeping with your head elevated, and getting adequate rest may also help. Replace your toothbrush once you start feeling better. A cough can last several weeks after a respiratory  illness as the airways heal, which is normal as long as symptoms are gradually improving. Go to the emergency room right away if you develop worsening shortness of breath, chest pain, high fever, or  any new concerning symptoms.      ED Prescriptions     Medication Sig Dispense Auth. Provider   cetirizine -pseudoephedrine  (ZYRTEC -D) 5-120 MG tablet Take 1 tablet by mouth daily with breakfast for 10 days. 10 tablet Iola Lukes, FNP   Dextromethorphan-guaiFENesin  (MUCINEX  DM MAXIMUM STRENGTH) 60-1200 MG TB12 Take 1 tablet by mouth 2 (two) times daily. 20 tablet Iola Lukes, FNP      PDMP not reviewed this encounter.     [1]  Social History Tobacco Use   Smoking status: Never   Smokeless tobacco: Never   Tobacco comments:    Herbal vape  Vaping Use   Vaping status: Never Used  Substance Use Topics   Alcohol use: No   Drug use: No     Iola Lukes, FNP 09/01/24 1152

## 2024-09-01 NOTE — Discharge Instructions (Addendum)
 Your symptoms are most likely due to a viral respiratory infection that has triggered an asthma flare. Continue taking the medications prescribed by your primary care provider yesterday. A decongestant has been added to help with post-nasal drainage, along with prescription Mucinex  to thin mucus so it can clear more easily. These medications will not cure the virus but should help you feel better as your body recovers. Because you work night shifts, take the antibiotic, Mucinex , and Hycodan before going to sleep in the morning, and take the steroid, decongestant, and second Mucinex  dose before going to work. Use the albuterol  inhaler as needed, and rinse your mouth after inhaler use to prevent oral yeast infection. Drink plenty of fluids to stay hydrated and help thin mucus, aiming for pale yellow urine. Using a cool-mist humidifier, inhaling steam, sleeping with your head elevated, and getting adequate rest may also help. Replace your toothbrush once you start feeling better. A cough can last several weeks after a respiratory illness as the airways heal, which is normal as long as symptoms are gradually improving. Go to the emergency room right away if you develop worsening shortness of breath, chest pain, high fever, or any new concerning symptoms.

## 2024-09-16 ENCOUNTER — Other Ambulatory Visit (HOSPITAL_COMMUNITY): Payer: Self-pay

## 2024-09-17 ENCOUNTER — Other Ambulatory Visit: Payer: Self-pay

## 2024-09-19 ENCOUNTER — Ambulatory Visit (HOSPITAL_COMMUNITY): Admitting: Licensed Clinical Social Worker

## 2024-09-19 DIAGNOSIS — F321 Major depressive disorder, single episode, moderate: Secondary | ICD-10-CM | POA: Diagnosis not present

## 2024-09-19 NOTE — Progress Notes (Signed)
 HERAPIST PROGRESS NOTE   Session Date: 09/19/2024  Session Time: 0806 - 0906  Virtual Visit via Video Note  I connected with Ruth Escobar on 09/19/2024 at  8:00 AM EST by a video enabled telemedicine application and verified that I am speaking with the correct person using two identifiers.  Location: Patient: Home Provider: Home office   I discussed the limitations of evaluation and management by telemedicine and the availability of in person appointments. The patient expressed understanding and agreed to proceed.  I discussed the assessment and treatment plan with the patient. The patient was provided an opportunity to ask questions and all were answered. The patient agreed with the plan and demonstrated an understanding of the instructions.   The patient was advised to call back or seek an in-person evaluation if the symptoms worsen or if the condition fails to improve as anticipated.  I provided 60 minutes of non-face-to-face time during this encounter.  Participation Level: Active  Behavioral Response: CasualAlertEuthymic  Type of Therapy: Individual Therapy  Treatment Goals addressed:   Progressing (2) LTG: Ruth Escobar will recognize socially inappropriate behaviors and develop alternative behaviors (Social Interpersonal Effectiveness) STG: Ruth Escobar will demonstrate interest in social activities by initiating/joining social activity without prompts (Social Interpersonal Effectiveness)  Initial (3) STG: Reduce overall depression score by a minimum of 25% on the Patient Health Questionnaire (PHQ-9) (OP Depression) STG: Ruth Escobar will identify cognitive patterns and beliefs that support depression (OP Depression) LTG: Try not to compare ourselves to others as it relates to life and where we are in our journey (OP Depression)  Completed/Met (4) LTG: Reduce frequency, intensity, and duration of depression symptoms so that daily functioning is improved (OP  Depression) LTG: Increase coping skills to manage depression and improve ability to perform daily activities (OP Depression) STG: Ruth Escobar will reduce frequency of avoidant behaviors by 50% as evidenced by self-report in therapy sessions (OP Depression) LTG: I just want to understand my emotions and triggers for depression better (OP Depression)  ProgressTowards Goals: Progressing  Interventions: CBT, Motivational Interviewing, and Supportive  Summary: Ruth Escobar is a 23 y.o. Trans female with psych history of MDD, presenting for follow-up therapy session in efforts to improve management of presenting stressors.  Patient actively engaged in session, presenting with an overall pleasant moods and congruent affect. During check-in, patient detailed having just gotten off work, further sharing of it having sucked, sharing the perspective of of the ED being compared to 2020 COVID, with the ED holding 28 pt's due to inabilities to move them anywhere due to increased flu and respiratory illnesses causing increased admissions. Pt shared of further workplace stress surrounding staffing shortages and limitations this presents for pt and added responsibilities.  Pt shared of having had time over the past month to focus on self and having found beneficial in allowing self time for self reflection surrounding factors related to previous visit and having determined increased observed depressive sxs being attributed to stressors and challenges pt has experienced throughout the past year to include various workplace related challenges, loss of multiple family members, physical health challenges, and feeling to not have accomplished any life goals pt had identified for self to work towards over the past year.  Pt expressed of having developed a plan for future career, with plan to attend [employer] to obtain Associates in Sci, with later transitioning to obtain a BS in science/pre-med with long-term goal to  pursue Physician Assistant degree.     08/22/2024  11:23 AM 02/08/2024    2:43 PM 10/31/2023    3:23 PM 10/17/2023    3:13 PM 10/03/2023    3:14 PM  Depression screen PHQ 2/9  Decreased Interest 1 0 0 0 1  Down, Depressed, Hopeless 2 0 0 0 0  PHQ - 2 Score 3 0 0 0 1  Altered sleeping 2 1 1  0 0  Tired, decreased energy 2 1 0 0 0  Change in appetite 1 0 0 1 0  Feeling bad or failure about yourself  1 0 0 0 1  Trouble concentrating 2 0 0 0 0  Moving slowly or fidgety/restless 1 0 1 0 0  Suicidal thoughts 1 0 0 0 0  PHQ-9 Score 13 2  2  1  2    Difficult doing work/chores Somewhat difficult Somewhat difficult Not difficult at all Not difficult at all Not difficult at all     Data saved with a previous flowsheet row definition    Suicidal/Homicidal: No; No plan, no intent.  Therapist Response:  Clinician openly greeted patient upon presenting for visit, assessing presenting moods and affect, engaging in introductory check-in. Utilized open ended questions in evoking recounts of recent events and stressors. Utilized active listening techniques to support pt's recounts. Therapist utilized supportive reflection, CBT, MI, psychoeducation, and solution-focused strategies to support navigation of stressors. Reassessed severity of sxs, and presence of any safety concerns.   []  Cognitive Challenging [x]  Cognitive Refocusing [x]  Cognitive Reframing  []  Communication Skills []  Compliance Issues []  DBT []  Exploration of Coping Patterns [x]  Exploration of Emotions [x]  Exploration of Relationship Patterns []  Guided Imagery []  Interactive Feedback [x]  Interpersonal Resolutions []  Mindfulness Training []  Preventative Services [x]  Psycho-Education []  Relaxation/Deep Breathing []  Review of Treatment Plan/Progress []  Role-Play/Behavioral Rehearsal [x]  Structured Problem Solving [x]  Supportive Reflection []  Symptom Management []  Other   Patient responded well to interventions. Patient continues to meet  criteria for MDD. Patient will continue to benefit from engagement in outpatient therapy due to being the least restrictive service to meet presenting needs. Pt proves to maintain moderate progressions towards tx goals.  Plan: Return again in 2 weeks.  Diagnosis:  Encounter Diagnosis  Name Primary?   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  Patient/Guardian was advised Release of Information must be obtained prior to any record release in order to collaborate their care with an outside provider. Patient/Guardian was advised if they have not already done so to contact the registration department to sign all necessary forms in order for us  to release information regarding their care.   Consent: Patient/Guardian gives verbal consent for treatment and assignment of benefits for services provided during this visit. Patient/Guardian expressed understanding and agreed to proceed.     Lynwood JONETTA Maris, MSW, LCSW 09/19/2024 8:07 AM

## 2024-09-25 ENCOUNTER — Ambulatory Visit (HOSPITAL_COMMUNITY): Admitting: Licensed Clinical Social Worker

## 2024-09-27 ENCOUNTER — Other Ambulatory Visit (HOSPITAL_COMMUNITY): Payer: Self-pay

## 2024-10-04 ENCOUNTER — Other Ambulatory Visit (HOSPITAL_COMMUNITY): Payer: Self-pay

## 2024-10-04 ENCOUNTER — Ambulatory Visit (INDEPENDENT_AMBULATORY_CARE_PROVIDER_SITE_OTHER): Admitting: Licensed Clinical Social Worker

## 2024-10-04 DIAGNOSIS — F321 Major depressive disorder, single episode, moderate: Secondary | ICD-10-CM

## 2024-10-04 MED ORDER — TESTOSTERONE CYPIONATE 200 MG/ML IM SOLN
40.0000 mg | INTRAMUSCULAR | 1 refills | Status: AC
  Filled 2024-10-04: qty 3, 21d supply, fill #0

## 2024-10-04 NOTE — Progress Notes (Signed)
 HERAPIST PROGRESS NOTE   Session Date: 10/04/2024  Session Time: 1113 - 1226  Participation Level: Active  Behavioral Response: CasualAlertEuthymic  Type of Therapy: Individual Therapy  Treatment Goals addressed:   Progressing (2) LTG: Ruth Escobar will recognize socially inappropriate behaviors and develop alternative behaviors (Social Interpersonal Effectiveness) STG: Ruth Escobar will demonstrate interest in social activities by initiating/joining social activity without prompts (Social Interpersonal Effectiveness)  Initial (3) STG: Reduce overall depression score by a minimum of 25% on the Patient Health Questionnaire (PHQ-9) (OP Depression) STG: Ruth Escobar will identify cognitive patterns and beliefs that support depression (OP Depression) LTG: Try not to compare ourselves to others as it relates to life and where we are in our journey (OP Depression)  Completed/Met (4) LTG: Reduce frequency, intensity, and duration of depression symptoms so that daily functioning is improved (OP Depression) LTG: Increase coping skills to manage depression and improve ability to perform daily activities (OP Depression) STG: Ruth Escobar will reduce frequency of avoidant behaviors by 50% as evidenced by self-report in therapy sessions (OP Depression) LTG: I just want to understand my emotions and triggers for depression better (OP Depression)  ProgressTowards Goals: Progressing  Interventions: CBT, Motivational Interviewing, and Supportive  Summary: [name] is a 24 y.o. Trans female with psych history of MDD, presenting for follow-up therapy session in efforts to improve management of presenting stressors.  Patient actively engaged in session, presenting with an overall pleasant moods and congruent affect. During check-in, patient detailed to be doing well overall.  Patient actively engaged in providing recounts of the past 2 weeks, sharing of work related stress, financial related  stress, and challenges surrounding navigating each.   Patient actively engaged in processing presenting stressors as it relates to financial hardship, exploring various contributing factors to include credit card balances, car payment, rent, and continued rise in cost of living; further exploring individual considerations of adjustments to include relocation to a more affordable apartment, and securing a second daytime job.  Engaged and actively processing factors that would create further stress, specifically needing to break lease and not having funds to do so, as well as if securing a daytime job, how this would proved to impact patient's abilities to be home and care for dog.  Explored as to whether patient has considered consulting with credit union through employer, detailing of having not done so however believing there to be minimal potential support due to current outstanding balances being with an outside credit union.  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  Actively engaged in processing the added exposure and education that this would provide, further exploring potential benefits of supporting patient when returning to college in the coming months and possibly aiding in increasing ease of understanding of education material, as well as potential to test out of some courses.     08/22/2024   11:23 AM 02/08/2024    2:43 PM 10/31/2023    3:23 PM  10/17/2023    3:13 PM 10/03/2023    3:14 PM  Depression screen PHQ 2/9  Decreased Interest 1 0 0 0 1  Down, Depressed, Hopeless 2 0 0 0 0  PHQ - 2 Score 3 0 0 0 1  Altered sleeping 2 1 1  0 0  Tired, decreased energy 2 1 0 0 0  Change in appetite 1 0 0 1 0  Feeling bad or failure about yourself  1 0 0 0 1  Trouble concentrating 2 0 0 0 0  Moving slowly or fidgety/restless 1 0 1 0 0  Suicidal thoughts 1 0 0 0 0  PHQ-9 Score 13 2  2  1  2    Difficult doing work/chores Somewhat difficult Somewhat difficult Not difficult at all Not difficult at all Not difficult at all     Data saved with a previous flowsheet row definition    Suicidal/Homicidal: No; No plan, no intent.  Therapist Response:  Clinician openly greeted patient upon presenting for visit, assessing presenting moods and affect, engaging in introductory check-in. Utilized open ended questions in evoking recounts of recent events and stressors. Utilized active listening techniques to support pt's recounts. Therapist utilized supportive reflection, CBT, MI, psychoeducation, and solution-focused strategies to support navigation of stressors. Reassessed severity of sxs, and presence of any safety concerns.  Patient responded well to interventions. Patient continues to meet criteria for MDD. Patient will continue to benefit from engagement in outpatient therapy due to being the least restrictive service to meet presenting needs. Pt proves to maintain moderate progressions towards tx goals.  [x]  Cognitive Challenging []  Cognitive Refocusing [x]  Cognitive Reframing  []  Communication Skills []  Compliance Issues []  DBT []  Exploration of Coping Patterns [x]  Exploration of Emotions []  Exploration of Relationship Patterns []  Guided Imagery []  Interactive Feedback [x]  Interpersonal Resolutions []  Mindfulness Training []  Preventative Services [x]  Psycho-Education []  Relaxation/Deep Breathing []  Review of Treatment Plan/Progress []   Role-Play/Behavioral Rehearsal [x]  Structured Problem Solving [x]  Supportive Reflection []  Symptom Management []  Other    Plan: Return again in 2 weeks.  Diagnosis:  Encounter Diagnosis  Name Primary?   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  Patient/Guardian was advised Release of Information must be obtained prior to any record release in order to collaborate their care with an outside provider. Patient/Guardian was advised if they have not already done so to contact the registration department to sign all necessary forms in order for us  to release information regarding their care.   Consent: Patient/Guardian gives verbal consent for treatment and assignment of benefits for services provided during this visit. Patient/Guardian expressed understanding and agreed to proceed.     Lynwood JONETTA Maris, MSW, LCSW 10/04/24 11:15 AM

## 2024-10-09 ENCOUNTER — Telehealth (HOSPITAL_COMMUNITY): Admitting: Family

## 2024-10-09 ENCOUNTER — Other Ambulatory Visit (HOSPITAL_COMMUNITY): Payer: Self-pay

## 2024-10-09 ENCOUNTER — Encounter (HOSPITAL_COMMUNITY): Payer: Self-pay

## 2024-10-09 DIAGNOSIS — F321 Major depressive disorder, single episode, moderate: Secondary | ICD-10-CM | POA: Diagnosis not present

## 2024-10-09 MED ORDER — GUANFACINE HCL ER 1 MG PO TB24: 1.0000 mg | ORAL_TABLET | Freq: Every day | ORAL | 1 refills | Status: AC

## 2024-10-09 NOTE — Progress Notes (Signed)
 Virtual Visit via Video Note  I connected with Ruth Escobar on 10/09/24 at  7:30 AM EST by a video enabled telemedicine application and verified that I am speaking with the correct person using two identifiers.  Location: Patient: Home Provider: Home Office   I discussed the limitations of evaluation and management by telemedicine and the availability of in person appointments. The patient expressed understanding and agreed to proceed.    I discussed the assessment and treatment plan with the patient. The patient was provided an opportunity to ask questions and all were answered. The patient agreed with the plan and demonstrated an understanding of the instructions.   The patient was advised to call back or seek an in-person evaluation if the symptoms worsen or if the condition fails to improve as anticipated.  I provided 15 minutes of non-face-to-face time during this encounter.   Staci LOISE Kerns, NP   [facility] MD/PA/NP OP Progress Note  10/09/2024 11:47 AM Ruth Escobar  MRN:  [id number]  Chief Complaint: Medication management follow-up appointment  Ruth Escobar Ruth Escobar presents for medication management follow-up appointment.  Seen and evaluated via virtually.  Tereasa prefers female pronouns.  Reports he has been taking and tolerating medications well, Wellbutrin  and PRN hydroxyzine .  States his mood has been even keel, just reminding my own business.    Reports he continues to be followed by therapy services with J. Moses LCSW.  States he has plans to return back to school in March.  Discussed initiating Intuniv  nonstimulant medication for concentration and focus.  As previously evaluated for attention deficit disorders.  No documented findings for stimulant medications.  Will continue to monitor symptoms.  Reports a good appetite denied that he is resting well throughout the night.  States resting for 4-hour blocks throughout the day.  Third shift financial controller.  Continue to monitor  symptoms.  Support, encouragement and reassurance was provided.  HPI: Ruth Escobar (he/him) preferred pronouns.  History with major depressive disorder, generalized anxiety disorder, adjustment disorder, gender dysphoria.    Visit Diagnosis:    ICD-10-CM   1. Current moderate episode of major depressive disorder without prior episode (HCC)  F32.1       Past Psychiatric History:  H/O major depressive disorder, generalized anxiety disorder currently prescribed Wellbutrin  and hydroxyzine .  Denied previous inpatient admissions  Past Medical History:  Past Medical History:  Diagnosis Date   Asthma    Precordial chest pain 06/22/2024   Stress Echocardiogram 06/21/24: No ischemia    PVC's (premature ventricular contractions) 05/28/2024   Monitor 04/2024: NSR, NSVT x 1 (19 beats); no A-fib/SVT/high-grade heart block or pauses    No past surgical history on file.  Family Psychiatric History:   Family History:  Family History  Problem Relation Age of Onset   Cancer Mother    Cancer Father    Hypertension Father    Heart failure Paternal Grandmother 20 - 69   Heart attack Paternal Grandmother    Heart attack Paternal Uncle 24 - 68       died suddenly    Social History:  Social History   Socioeconomic History   Marital status: Single    Spouse name: Not on file   Number of children: Not on file   Years of education: Not on file   Highest education level: Not on file  Occupational History    Employer: [employer]    Industry: Other / Not Applicable    Comment: EMT at [employer]  Tobacco Use   Smoking status: Never   Smokeless tobacco: Never   Tobacco comments:    Herbal vape  Vaping Use   Vaping status: Never Used  Substance and Sexual Activity   Alcohol use: No   Drug use: No   Sexual activity: Not on file  Other Topics Concern   Not on file  Social History Narrative   Not on file   Social Drivers of Health   Tobacco Use: Low Risk (09/01/2024)   Patient  History    Smoking Tobacco Use: Never    Smokeless Tobacco Use: Never    Passive Exposure: Not on file  Financial Resource Strain: Medium Risk (06/13/2024)   [facility]   Overall Financial Resource Strain (CARDIA)    Difficulty of Paying Living Expenses: Somewhat hard  Food Insecurity: Food Insecurity Present (06/13/2024)   Received from [facility]   Epic    Within the past 12 months, you worried that your food would run out before you got the money to buy more.: Sometimes true    Within the past 12 months, the food you bought just didn't last and you didn't have money to get more.: Never true  Transportation Needs: No Transportation Needs (06/13/2024)   Received from [facility]    In the past 12 months, has lack of transportation kept you from medical appointments or from getting medications?: No    Lack of Transportation (Non-Medical): No  Physical Activity: Not on file  Stress: Not on file  Social Connections: Not on file  Depression (PHQ2-9): High Risk (08/22/2024)   Depression (PHQ2-9)    PHQ-2 Score: 13  Alcohol Screen: Not on file  Housing: High Risk (06/13/2024)   [facility]   Epic    In the last 12 months, was there a time when you were not able to pay the mortgage or rent on time?: Yes    In the past 12 months, how many times have you moved where you were living?: 0    At any time in the past 12 months, were you homeless or living in a shelter (including now)?: No  Utilities: Not At Risk (06/13/2024)   Received from [facility]   Epic    In the past 12 months has the electric, gas, oil, or water company threatened to shut off services in your home?: No  Health Literacy: Not on file    Allergies: Allergies[1]  Metabolic Disorder Labs: No results found for: HGBA1C, MPG No results found for: PROLACTIN No results found  for: CHOL, TRIG, HDL, CHOLHDL, VLDL, LDLCALC No results found for: TSH  Therapeutic Level Labs: No results found for: LITHIUM No results found for: VALPROATE No results found for: CBMZ  Current Medications: Current Outpatient Medications  Medication Sig Dispense Refill   guanFACINE  (INTUNIV ) 1 MG TB24 ER tablet Take 1 tablet (1 mg total) by mouth daily. 30 tablet 1   albuterol  (PROAIR  HFA) 108 (90 Base) MCG/ACT inhaler Inhale 2 puffs into the lungs every 6 (six) hours as needed. 18 g 0   buPROPion  (WELLBUTRIN  XL) 150 MG 24 hr tablet Take 1 tablet (150 mg total) by mouth every morning. 90 tablet 1   Cholecalciferol (VITAMIN D-1000 MAX ST) 25 MCG (1000 UT) tablet Take 5,000 Units by mouth daily.     Dextromethorphan-guaiFENesin  (MUCINEX  DM MAXIMUM STRENGTH) 60-1200 MG TB12 Take 1 tablet by  mouth 2 (two) times daily. 20 tablet 0   EPINEPHrine  0.3 mg/0.3 mL IJ SOAJ injection Inject 0.3 mg into the muscle as needed for anaphylaxis.     EPINEPHrine  0.3 mg/0.3 mL IJ SOAJ injection Inject 0.3mg  as directed for life threatening allergic reaction. 2 each 3   HYDROcodone  bit-homatropine (HYCODAN) 5-1.5 MG/5ML syrup Take 5 mLs by mouth every 4 (four) hours as needed. 180 mL 0   hydrOXYzine  (ATARAX ) 10 MG tablet Take 1 tablet (10 mg total) by mouth at bedtime as needed and may repeat dose one time if needed. 30 tablet 0   metoprolol  succinate (TOPROL -XL) 25 MG 24 hr tablet Take 1 tablet (25 mg total) by mouth daily. 90 tablet 3   NEEDLE, DISP, 18 G 18G X 1 MISC Use as directed to draw up testosterone  weekly. 20 each 1   Norethindrone  Acetate-Ethinyl Estrad-FE (BLISOVI  24 FE) 1-20 MG-MCG(24) tablet Blisovi  24 Fe 1 mg-20 mcg (24)/75 mg (4) tablet  TAKE 1 TABLET BY MOUTH EVERY DAY     Norethindrone  Acetate-Ethinyl Estrad-FE (HAILEY  24 FE) 1-20 MG-MCG(24) tablet Take 1 tablet by mouth daily. 84 tablet 3   pantoprazole  (PROTONIX ) 20 MG tablet Take 1 tablet (20 mg total) by mouth daily. 28  tablet 0   pantoprazole  (PROTONIX ) 40 MG tablet Take 1 tablet (40 mg total) by mouth daily. 90 tablet 1   semaglutide -weight management (WEGOVY ) 0.25 MG/0.5ML SOAJ SQ injection Inject 0.25 mg into the skin once a week. (Patient not taking: Reported on 09/01/2024) 2 mL 1   sucralfate  (CARAFATE ) 1 g tablet Take 1 tablet (1 g total) by mouth 4 (four) times daily -  with meals and at bedtime. (Patient not taking: Reported on 07/09/2024) 30 tablet 0   sucralfate  (CARAFATE ) 1 g tablet Take 1 tablet (1 g total) by mouth 4 (four) times daily on an empty stomach. 120 tablet 1   testosterone  cypionate (DEPOTESTOSTERONE CYPIONATE) 200 MG/ML injection Inject 100 mg into the muscle once a week.     testosterone  cypionate (DEPOTESTOSTERONE CYPIONATE) 200 MG/ML injection Inject 0.2 mLs (40 mg total) into the skin every 7 (seven) days. 3 mL 1   TUBERCULIN SYR 1CC/25GX5/8 25G X 5/8 1 ML MISC Use 1 syringe once a week for subcutaneous testosterone  injection. 50 each 1   No current facility-administered medications for this visit.     Musculoskeletal:   Psychiatric Specialty Exam: Review of Systems  There were no vitals taken for this visit.There is no height or weight on file to calculate BMI.  General Appearance: NA reported  he was walking his dog  Eye Contact:  NA  Speech:  Clear and Coherent  Volume:  Normal  Mood:  Euthymic  Affect:  Congruent  Thought Process:  Coherent  Orientation:  Full (Time, Place, and Person)  Thought Content: Logical   Suicidal Thoughts:  No  Homicidal Thoughts:  No  Memory:  Immediate;   Good Recent;   Good  Judgement:  Good  Insight:  Fair  Psychomotor Activity:  Normal  Concentration:  Concentration: Good  Recall:  Good  Fund of Knowledge: Good  Language: Good  Akathisia:  NA  Handed:  Right  AIMS (if indicated): not done  Assets:  Communication Skills Desire for Improvement  ADL's:  Intact  Cognition: WNL  Sleep:  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,  reported sleeping 4 hours at a time and will sleep about 15-16 hours on his days off   Screenings: GAD-7    Flowsheet  Row Counselor from 08/22/2024 in [facility] from 02/08/2024 in [facility] at [facility] from 10/31/2023 in [facility] at [facility] from 10/17/2023 in [facility] at [facility] from 10/03/2023 in [facility] at [facility]  Total GAD-7 Score 3 2 1 1 3    PHQ2-9    Flowsheet Row Counselor from 08/22/2024 in [facility] at [facility] from 02/08/2024 in [facility] at [facility] from 10/31/2023 in [place] [facility] at [place] Counselor from 10/17/2023 in [place] [facility] at [place] Counselor from 10/03/2023 in [facility] at [facility] Total Score 3 0 0 0 1  PHQ-9 Total Score 13 2 2 1 2    Flowsheet Row UC from 09/01/2024 in [facility] from 08/22/2024 in [place] [facility] at [facility] from 06/18/2024 in [facility] at [facility]  C-SSRS RISK CATEGORY No Risk Moderate Risk No Risk     Assessment and Plan: Ruth Tereasa Escobar seen and evaluated for medication management follow-up appointment.  Currently prescribed Wellbutrin  150 mg and hydroxyzine  25 mg p.o. as needed.  Reported future plans to reenroll in college classes.  Discussed initiating Intuniv  nonstimulant medication as he continues to have concerns related to concentration and focus.  Interrupted sleep pattern of note patient works third shift and is frequently awakening throughout the day to care for his dog.  Discussed adequate amount of sleep daily for mental health.  He appeared  amendable to plan.  Patient to start Intuniv  follow-up 2 months prior to starting classes.  Support, encouragement  and reassurance was provided.  Collaboration of Care: Collaboration of Care: Medication Management AEB patient to start Intunive  Patient/Guardian was advised Release of Information must be obtained prior to any record release in order to collaborate their care with an outside provider. Patient/Guardian was advised if they have not already done so to contact the registration department to sign all necessary forms in order for us  to release information regarding their care.   Consent: Patient/Guardian gives verbal consent for treatment and assignment of benefits for services provided during this visit. Patient/Guardian expressed understanding and agreed to proceed.    Staci LOISE Kerns, NP 10/09/2024, 11:47 AM      [1]  Allergies Allergen Reactions   Bee Pollen     Other reaction(s): Cough   Citrus Rash   Pollen Extract Cough   Shellfish Allergy  Anaphylaxis   Pimecrolimus Other (See Comments) and Rash    Other Reaction(s): Other (See Comments)  rash  Other reaction(s): Unknown  rash  Other Reaction(s): Unknown   Povidone-Iodine Rash

## 2024-10-10 ENCOUNTER — Other Ambulatory Visit (HOSPITAL_COMMUNITY): Payer: Self-pay | Admitting: *Deleted

## 2024-10-10 ENCOUNTER — Other Ambulatory Visit (HOSPITAL_COMMUNITY): Payer: Self-pay

## 2024-10-10 MED ORDER — BUPROPION HCL ER (XL) 150 MG PO TB24: 150.0000 mg | ORAL_TABLET | ORAL | 2 refills | Status: AC

## 2024-10-12 ENCOUNTER — Ambulatory Visit (HOSPITAL_COMMUNITY): Admitting: Licensed Clinical Social Worker

## 2024-10-17 ENCOUNTER — Ambulatory Visit (INDEPENDENT_AMBULATORY_CARE_PROVIDER_SITE_OTHER): Admitting: Licensed Clinical Social Worker

## 2024-10-17 DIAGNOSIS — F321 Major depressive disorder, single episode, moderate: Secondary | ICD-10-CM

## 2024-10-17 NOTE — Progress Notes (Signed)
 THERAPIST PROGRESS NOTE   Session Date: 10/17/2024  Session Time: 1415 - 1512 Virtual Visit via Video Note  I connected with Ruth Escobar on 10/17/24 at  2:00 PM EST by a video enabled telemedicine application and verified that I am speaking with the correct person using two identifiers.  Location: Patient: Home Provider: Home Office   I discussed the limitations of evaluation and management by telemedicine and the availability of in person appointments. The patient expressed understanding and agreed to proceed.   I discussed the assessment and treatment plan with the patient. The patient was provided an opportunity to ask questions and all were answered. The patient agreed with the plan and demonstrated an understanding of the instructions.   The patient was advised to call back or seek an in-person evaluation if the symptoms worsen or if the condition fails to improve as anticipated.  I provided 57 minutes of non-face-to-face time during this encounter.  Participation Level: Active  Behavioral Response: CasualAlertEuthymic  Type of Therapy: Individual Therapy  Treatment Goals addressed:   Progressing (2) LTG: Ruth Escobar will recognize socially inappropriate behaviors and develop alternative behaviors (Social Interpersonal Effectiveness) STG: Ruth Escobar will demonstrate interest in social activities by initiating/joining social activity without prompts (Social Interpersonal Effectiveness)  Initial (3) STG: Reduce overall depression score by a minimum of 25% on the Patient Health Questionnaire (PHQ-9) (OP Depression) STG: Ruth Escobar will identify cognitive patterns and beliefs that support depression (OP Depression) LTG: Try not to compare ourselves to others as it relates to life and where we are in our journey (OP Depression)  Completed/Met (4) LTG: Reduce frequency, intensity, and duration of depression symptoms so that daily functioning is improved (OP  Depression) LTG: Increase coping skills to manage depression and improve ability to perform daily activities (OP Depression) STG: Ruth Escobar will reduce frequency of avoidant behaviors by 50% as evidenced by self-report in therapy sessions (OP Depression) LTG: I just want to understand my emotions and triggers for depression better (OP Depression)  ProgressTowards Goals: Progressing  Interventions: CBT, Motivational Interviewing, and Supportive  Summary: Ruth Escobar is a 24 y.o. Trans female with psych history of MDD, presenting for follow-up therapy session in efforts to improve management of presenting stressors.  Patient actively engaged throughout session, presenting with pleasant mood and congruent affect. During check-in, he reported doing generally well but described notable increases in occupational stress over the past week due to recent winter weather, staffing shortages, and a surge in ED patient volume. He discussed challenges commuting to work during icy conditions and noted that delays, safety concerns, and environmental barriers added to his overall stress load.  Patient provided detailed recounts of recent workplace experiences, including difficulty navigating patient and family expectations during high-acuity encounters. He described examples such as educating patients and families on pre-operative NPO requirements and managing safety concerns when a disorganized, paranoid patient became assaultive toward staff. He processed the emotional and cognitive strain associated with balancing patient needs, maintaining safety, and functioning within a system impacted by call-outs and reduced staffing.  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 He examined how responding to chaos provides a sense of purpose, structure, and  stimulation, while also acknowledging the risk of burnout and emotional fatigue. 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  08/22/2024   11:23 AM 02/08/2024    2:43 PM 10/31/2023    3:23 PM 10/17/2023    3:13 PM 10/03/2023    3:14 PM  Depression screen PHQ 2/9  Decreased Interest 1 0 0 0 1  Down, Depressed, Hopeless 2 0 0 0 0  PHQ - 2 Score 3 0 0 0 1  Altered sleeping 2 1 1  0 0  Tired, decreased energy 2 1 0 0 0  Change in appetite 1 0 0 1 0  Feeling bad or failure about yourself  1 0 0 0 1  Trouble concentrating 2 0 0 0 0  Moving slowly or fidgety/restless 1 0 1 0 0  Suicidal thoughts 1 0 0 0 0  PHQ-9 Score 13 2  2  1  2    Difficult doing work/chores Somewhat difficult Somewhat difficult Not difficult at all Not difficult at all Not difficult at all     Data saved with a previous flowsheet row definition    Suicidal/Homicidal: No; No plan, no intent.  Therapist Response:  Clinician openly greeted patient upon presenting for visit, assessing presenting moods and affect, engaging in introductory check-in. Utilized open ended questions in evoking recounts of recent events and stressors. Utilized active listening techniques to support pt's recounts. Clinician utilized supportive reflection, cognitive restructuring, and exploratory questioning to help the patient process occupational stressors and identify patterns in his attraction to high-stimulus environments. Interventions focused on enhancing insight into emotional responses, reinforcing adaptive coping skills, and supporting differentiation between purposeful engagement and overextension. Clinician validated the patients resilience while encouraging ongoing attention to stress regulation and professional sustainability. Reassessed severity of sxs, and presence of any safety concerns.  Patient responded  well to interventions. Patient continues to meet criteria for MDD. Patient will continue to benefit from engagement in outpatient therapy due to being the least restrictive service to meet presenting needs. Pt proves to maintain moderate progressions towards tx goals.  [x]  Cognitive Challenging []  Cognitive Refocusing [x]  Cognitive Reframing  []  Communication Skills []  Compliance Issues []  DBT [x]  Exploration of Coping Patterns [x]  Exploration of Emotions []  Exploration of Relationship Patterns []  Guided Imagery []  Interactive Feedback [x]  Interpersonal Resolutions []  Mindfulness Training []  Preventative Services [x]  Psycho-Education []  Relaxation/Deep Breathing []  Review of Treatment Plan/Progress []  Role-Play/Behavioral Rehearsal [x]  Structured Problem Solving [x]  Supportive Reflection []  Symptom Management []  Other    Plan: Return again in 2 weeks.  Diagnosis:  Encounter Diagnosis  Name Primary?   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  Patient/Guardian was advised Release of Information must be obtained prior to any record release in order to collaborate their care with an outside provider. Patient/Guardian was advised if they have not already done so to contact the registration department to sign all necessary forms in order for us  to release information regarding their care.   Consent: Patient/Guardian gives verbal consent for treatment and assignment of benefits for services provided during this visit. Patient/Guardian expressed understanding and agreed to proceed.     Lynwood JONETTA Maris, MSW, LCSW 10/17/24 2:27 PM
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
## 2024-11-01 ENCOUNTER — Ambulatory Visit (HOSPITAL_COMMUNITY): Admitting: Licensed Clinical Social Worker

## 2024-11-13 ENCOUNTER — Ambulatory Visit (HOSPITAL_COMMUNITY): Admitting: Licensed Clinical Social Worker

## 2024-12-04 ENCOUNTER — Telehealth (HOSPITAL_COMMUNITY): Admitting: Family
